# Patient Record
Sex: Female | Born: 1950 | Race: Black or African American | Hispanic: No | Marital: Single | Smoking: Never smoker
Health system: Southern US, Community
[De-identification: ages and names within clinical notes are randomized; demographics above are authoritative.]

## PROBLEM LIST (undated history)

## (undated) DIAGNOSIS — I739 Peripheral vascular disease, unspecified: Secondary | ICD-10-CM

## (undated) DIAGNOSIS — F1090 Alcohol use, unspecified, uncomplicated: Secondary | ICD-10-CM

## (undated) DIAGNOSIS — S46819A Strain of other muscles, fascia and tendons at shoulder and upper arm level, unspecified arm, initial encounter: Secondary | ICD-10-CM

## (undated) DIAGNOSIS — I471 Supraventricular tachycardia, unspecified: Secondary | ICD-10-CM

## (undated) DIAGNOSIS — K219 Gastro-esophageal reflux disease without esophagitis: Secondary | ICD-10-CM

## (undated) DIAGNOSIS — E559 Vitamin D deficiency, unspecified: Secondary | ICD-10-CM

## (undated) DIAGNOSIS — E669 Obesity, unspecified: Secondary | ICD-10-CM

## (undated) DIAGNOSIS — I509 Heart failure, unspecified: Secondary | ICD-10-CM

## (undated) DIAGNOSIS — J449 Chronic obstructive pulmonary disease, unspecified: Secondary | ICD-10-CM

## (undated) DIAGNOSIS — E785 Hyperlipidemia, unspecified: Secondary | ICD-10-CM

## (undated) DIAGNOSIS — I10 Essential (primary) hypertension: Secondary | ICD-10-CM

## (undated) DIAGNOSIS — R350 Frequency of micturition: Secondary | ICD-10-CM

## (undated) DIAGNOSIS — J309 Allergic rhinitis, unspecified: Secondary | ICD-10-CM

## (undated) DIAGNOSIS — Z6837 Body mass index (BMI) 37.0-37.9, adult: Secondary | ICD-10-CM

## (undated) DIAGNOSIS — I110 Hypertensive heart disease with heart failure: Secondary | ICD-10-CM

## (undated) DIAGNOSIS — D573 Sickle-cell trait: Secondary | ICD-10-CM

## (undated) DIAGNOSIS — I700 Atherosclerosis of aorta: Secondary | ICD-10-CM

## (undated) DIAGNOSIS — I2510 Atherosclerotic heart disease of native coronary artery without angina pectoris: Secondary | ICD-10-CM

## (undated) DIAGNOSIS — I517 Cardiomegaly: Secondary | ICD-10-CM

## (undated) DIAGNOSIS — M1990 Unspecified osteoarthritis, unspecified site: Secondary | ICD-10-CM

## (undated) DIAGNOSIS — E279 Disorder of adrenal gland, unspecified: Secondary | ICD-10-CM

## (undated) HISTORY — DX: Peripheral vascular disease, unspecified: I73.9

## (undated) HISTORY — DX: Hypertensive heart disease with heart failure: I11.0

## (undated) HISTORY — DX: Chronic obstructive pulmonary disease, unspecified: J44.9

## (undated) HISTORY — DX: Body mass index (BMI) 37.0-37.9, adult: Z68.37

## (undated) HISTORY — DX: Sickle-cell trait: D57.3

## (undated) HISTORY — DX: Cardiomegaly: I51.7

## (undated) HISTORY — DX: Unspecified osteoarthritis, unspecified site: M19.90

## (undated) HISTORY — DX: Atherosclerosis of aorta: I70.0

## (undated) HISTORY — DX: Vitamin D deficiency, unspecified: E55.9

## (undated) HISTORY — DX: Hyperlipidemia, unspecified: E78.5

## (undated) HISTORY — DX: Alcohol use, unspecified, uncomplicated: F10.90

## (undated) HISTORY — PX: NO PAST SURGERIES: SHX2092

## (undated) HISTORY — DX: Heart failure, unspecified: I50.9

## (undated) HISTORY — DX: Atherosclerotic heart disease of native coronary artery without angina pectoris: I25.10

## (undated) HISTORY — DX: Obesity, unspecified: E66.9

## (undated) HISTORY — PX: TUBAL LIGATION: SHX77

## (undated) HISTORY — DX: Allergic rhinitis, unspecified: J30.9

## (undated) HISTORY — DX: Gastro-esophageal reflux disease without esophagitis: K21.9

## (undated) HISTORY — DX: Frequency of micturition: R35.0

## (undated) HISTORY — DX: Strain of other muscles, fascia and tendons at shoulder and upper arm level, unspecified arm, initial encounter: S46.819A

## (undated) HISTORY — DX: Disorder of adrenal gland, unspecified: E27.9

## (undated) HISTORY — DX: Essential (primary) hypertension: I10

## (undated) HISTORY — DX: Supraventricular tachycardia, unspecified: I47.10

---

## 1998-05-15 ENCOUNTER — Encounter: Admission: RE | Admit: 1998-05-15 | Discharge: 1998-05-15 | Payer: Self-pay | Admitting: Internal Medicine

## 1998-07-02 ENCOUNTER — Encounter: Payer: Self-pay | Admitting: Emergency Medicine

## 1998-07-02 ENCOUNTER — Emergency Department (HOSPITAL_COMMUNITY): Admission: EM | Admit: 1998-07-02 | Discharge: 1998-07-03 | Payer: Self-pay | Admitting: Emergency Medicine

## 1998-09-15 ENCOUNTER — Emergency Department (HOSPITAL_COMMUNITY): Admission: EM | Admit: 1998-09-15 | Discharge: 1998-09-15 | Payer: Self-pay | Admitting: Emergency Medicine

## 1998-09-23 ENCOUNTER — Encounter: Payer: Self-pay | Admitting: Emergency Medicine

## 1998-09-23 ENCOUNTER — Emergency Department (HOSPITAL_COMMUNITY): Admission: EM | Admit: 1998-09-23 | Discharge: 1998-09-23 | Payer: Self-pay | Admitting: Emergency Medicine

## 1999-06-21 ENCOUNTER — Encounter: Admission: RE | Admit: 1999-06-21 | Discharge: 1999-06-21 | Payer: Self-pay | Admitting: Internal Medicine

## 2000-05-31 ENCOUNTER — Encounter: Admission: RE | Admit: 2000-05-31 | Discharge: 2000-05-31 | Payer: Self-pay | Admitting: Internal Medicine

## 2001-06-03 ENCOUNTER — Emergency Department (HOSPITAL_COMMUNITY): Admission: EM | Admit: 2001-06-03 | Discharge: 2001-06-04 | Payer: Self-pay | Admitting: Emergency Medicine

## 2001-08-09 ENCOUNTER — Encounter: Admission: RE | Admit: 2001-08-09 | Discharge: 2001-08-09 | Payer: Self-pay | Admitting: Internal Medicine

## 2004-11-26 ENCOUNTER — Emergency Department (HOSPITAL_COMMUNITY): Admission: EM | Admit: 2004-11-26 | Discharge: 2004-11-26 | Payer: Self-pay | Admitting: Emergency Medicine

## 2008-01-28 ENCOUNTER — Emergency Department (HOSPITAL_COMMUNITY): Admission: EM | Admit: 2008-01-28 | Discharge: 2008-01-29 | Payer: Self-pay | Admitting: Emergency Medicine

## 2009-01-05 ENCOUNTER — Encounter: Payer: Self-pay | Admitting: Family Medicine

## 2009-09-20 ENCOUNTER — Inpatient Hospital Stay (HOSPITAL_COMMUNITY): Admission: EM | Admit: 2009-09-20 | Discharge: 2009-09-21 | Payer: Self-pay | Admitting: Emergency Medicine

## 2009-10-23 ENCOUNTER — Encounter: Payer: Self-pay | Admitting: Family Medicine

## 2009-11-20 ENCOUNTER — Encounter: Payer: Self-pay | Admitting: Family Medicine

## 2010-02-19 ENCOUNTER — Ambulatory Visit: Payer: Self-pay | Admitting: Family Medicine

## 2010-02-19 DIAGNOSIS — IMO0001 Reserved for inherently not codable concepts without codable children: Secondary | ICD-10-CM | POA: Insufficient documentation

## 2010-02-19 DIAGNOSIS — E669 Obesity, unspecified: Secondary | ICD-10-CM

## 2010-02-19 DIAGNOSIS — M25519 Pain in unspecified shoulder: Secondary | ICD-10-CM

## 2010-04-02 ENCOUNTER — Other Ambulatory Visit: Admission: RE | Admit: 2010-04-02 | Discharge: 2010-04-02 | Payer: Self-pay | Admitting: Family Medicine

## 2010-04-02 ENCOUNTER — Ambulatory Visit: Payer: Self-pay | Admitting: Family Medicine

## 2010-04-02 DIAGNOSIS — K921 Melena: Secondary | ICD-10-CM

## 2010-04-02 DIAGNOSIS — Z780 Asymptomatic menopausal state: Secondary | ICD-10-CM | POA: Insufficient documentation

## 2010-04-05 ENCOUNTER — Ambulatory Visit: Payer: Self-pay | Admitting: Family Medicine

## 2010-04-07 ENCOUNTER — Encounter (INDEPENDENT_AMBULATORY_CARE_PROVIDER_SITE_OTHER): Payer: Self-pay | Admitting: *Deleted

## 2010-04-09 LAB — CONVERTED CEMR LAB
ALT: 19 units/L (ref 0–35)
Alkaline Phosphatase: 72 units/L (ref 39–117)
BUN: 15 mg/dL (ref 6–23)
Basophils Absolute: 0 10*3/uL (ref 0.0–0.1)
Cholesterol: 236 mg/dL — ABNORMAL HIGH (ref 0–200)
Eosinophils Absolute: 0.2 10*3/uL (ref 0.0–0.7)
GFR calc non Af Amer: 142.03 mL/min (ref >60)
HDL: 49.4 mg/dL (ref >39.00)
Hemoglobin: 11.9 g/dL — ABNORMAL LOW (ref 12.0–15.0)
Monocytes Relative: 5.9 % (ref 3.0–12.0)
Platelets: 283 10*3/uL (ref 150.0–400.0)
Potassium: 4.2 meq/L (ref 3.5–5.1)
RBC: 4.16 M/uL (ref 3.87–5.11)
Sodium: 146 meq/L — ABNORMAL HIGH (ref 135–145)
Total Bilirubin: 0.6 mg/dL (ref 0.3–1.2)
Total Protein: 7.1 g/dL (ref 6.0–8.3)

## 2010-04-12 ENCOUNTER — Telehealth (INDEPENDENT_AMBULATORY_CARE_PROVIDER_SITE_OTHER): Payer: Self-pay | Admitting: *Deleted

## 2010-04-16 ENCOUNTER — Encounter (INDEPENDENT_AMBULATORY_CARE_PROVIDER_SITE_OTHER): Payer: Self-pay

## 2010-04-20 ENCOUNTER — Encounter: Payer: Self-pay | Admitting: Family Medicine

## 2010-04-20 ENCOUNTER — Ambulatory Visit: Payer: Self-pay | Admitting: Gastroenterology

## 2010-04-20 ENCOUNTER — Encounter: Admission: RE | Admit: 2010-04-20 | Discharge: 2010-04-20 | Payer: Self-pay | Admitting: Family Medicine

## 2010-04-29 ENCOUNTER — Encounter: Payer: Self-pay | Admitting: Family Medicine

## 2010-04-29 ENCOUNTER — Encounter: Admission: RE | Admit: 2010-04-29 | Discharge: 2010-04-29 | Payer: Self-pay | Admitting: Family Medicine

## 2010-06-22 ENCOUNTER — Ambulatory Visit: Payer: Self-pay | Admitting: Gastroenterology

## 2010-10-14 ENCOUNTER — Ambulatory Visit
Admission: RE | Admit: 2010-10-14 | Discharge: 2010-10-14 | Payer: Self-pay | Source: Home / Self Care | Attending: Family Medicine | Admitting: Family Medicine

## 2010-10-14 DIAGNOSIS — E785 Hyperlipidemia, unspecified: Secondary | ICD-10-CM | POA: Insufficient documentation

## 2010-10-16 ENCOUNTER — Other Ambulatory Visit: Payer: Self-pay | Admitting: Family Medicine

## 2010-10-16 DIAGNOSIS — N63 Unspecified lump in unspecified breast: Secondary | ICD-10-CM

## 2010-10-17 ENCOUNTER — Encounter: Payer: Self-pay | Admitting: Family Medicine

## 2010-10-21 ENCOUNTER — Ambulatory Visit
Admission: RE | Admit: 2010-10-21 | Discharge: 2010-10-21 | Payer: Self-pay | Source: Home / Self Care | Attending: Family Medicine | Admitting: Family Medicine

## 2010-10-21 ENCOUNTER — Other Ambulatory Visit: Payer: Self-pay | Admitting: Family Medicine

## 2010-10-21 ENCOUNTER — Telehealth (INDEPENDENT_AMBULATORY_CARE_PROVIDER_SITE_OTHER): Payer: Self-pay | Admitting: *Deleted

## 2010-10-21 LAB — HEPATIC FUNCTION PANEL
ALT: 15 U/L (ref 0–35)
AST: 17 U/L (ref 0–37)
Total Bilirubin: 0.6 mg/dL (ref 0.3–1.2)

## 2010-10-21 LAB — CBC WITH DIFFERENTIAL/PLATELET
Basophils Relative: 0.5 % (ref 0.0–3.0)
Eosinophils Absolute: 0.2 10*3/uL (ref 0.0–0.7)
HCT: 36.6 % (ref 36.0–46.0)
Lymphs Abs: 2 10*3/uL (ref 0.7–4.0)
MCV: 83.9 fl (ref 78.0–100.0)
RBC: 4.36 Mil/uL (ref 3.87–5.11)
WBC: 9.6 10*3/uL (ref 4.5–10.5)

## 2010-10-21 LAB — IBC PANEL
Iron: 64 ug/dL (ref 42–145)
Saturation Ratios: 18 % — ABNORMAL LOW (ref 20.0–50.0)
Transferrin: 254.5 mg/dL (ref 212.0–360.0)

## 2010-10-21 LAB — LIPID PANEL
Cholesterol: 250 mg/dL — ABNORMAL HIGH (ref 0–200)
HDL: 44.5 mg/dL (ref >39.00)
Triglycerides: 129 mg/dL (ref 0.0–149.0)

## 2010-10-21 LAB — FERRITIN: Ferritin: 61.9 ng/mL (ref 10.0–291.0)

## 2010-10-24 LAB — CONVERTED CEMR LAB: Pap Smear: NEGATIVE

## 2010-10-28 NOTE — Letter (Signed)
Summary: [facility] Instructions  [facility]  [address] [place], [place] [address]   Phone: [phone]  Fax: [phone]       [facility]    [birth year]    MRN: [id number]        Procedure Day /Date:  04/28/10   Wednesday     Arrival Time:  1:30pm     Procedure Time:  2:30pm     Location of Procedure:                    _x _  [facility] (4th Floor)                        PREPARATION FOR COLONOSCOPY WITH MOVIPREP   Starting 5 days prior to your procedure _7/29/11 _ do not eat nuts, seeds, popcorn, corn, beans, peas,  salads, or any raw vegetables.  Do not take any fiber supplements (e.g. Metamucil, Citrucel, and Benefiber).  THE DAY BEFORE YOUR PROCEDURE         DATE:   61/2/11  DAY:  Tuesday 1.  Drink clear liquids the entire day-NO SOLID FOOD  2.  Do not drink anything colored red or purple.  Avoid juices with pulp.  No orange juice.  3.  Drink at least 64 oz. (8 glasses) of fluid/clear liquids during the day to prevent dehydration and help the prep work efficiently.  CLEAR LIQUIDS INCLUDE: Water Jello Ice Popsicles Tea (sugar ok, no milk/cream) Powdered fruit flavored drinks Coffee (sugar ok, no milk/cream) Gatorade Juice: apple, white grape, white cranberry  Lemonade Clear bullion, consomm, broth Carbonated beverages (any kind) Strained chicken noodle soup Hard Candy                             4.  In the morning, mix first dose of MoviPrep solution:    Empty 1 Pouch A and 1 Pouch B into the disposable container    Add lukewarm drinking water to the top line of the container. Mix to dissolve    Refrigerate (mixed solution should be used within 24 hrs)  5.  Begin drinking the prep at 5:00 p.m. The MoviPrep container is divided by 4 marks.   Every 15 minutes drink the solution down to the next mark (approximately 8 oz) until the full liter is complete.   6.  Follow completed prep with 16 oz of clear liquid of your choice  (Nothing red or purple).  Continue to drink clear liquids until bedtime.  7.  Before going to bed, mix second dose of MoviPrep solution:    Empty 1 Pouch A and 1 Pouch B into the disposable container    Add lukewarm drinking water to the top line of the container. Mix to dissolve    Refrigerate  THE DAY OF YOUR PROCEDURE      DATE:   04/28/10  DAY:  Tuesday  Beginning at 9:30am (5 hours before procedure):         1. Every 15 minutes, drink the solution down to the next mark (approx 8 oz) until the full liter is complete.  2. Follow completed prep with 16 oz. of clear liquid of your choice.    3. You may drink clear liquids until  12:30pm      (2 hours before your procedure)   MEDICATION INSTRUCTIONS  Unless otherwise instructed, you should take regular prescription medications  with a small sip of water   as early as possible the morning of your procedure.         OTHER INSTRUCTIONS  You will need a responsible adult at least 61 years of age to accompany you and drive you home.   This person must remain in the waiting room during your procedure.  Wear loose fitting clothing that is easily removed.  Leave jewelry and other valuables at home.  However, you may wish to bring a book to read or  an iPod/MP3 player to listen to music as you wait for your procedure to start.  Remove all body piercing jewelry and leave at home.  Total time from sign-in until discharge is approximately 2-3 hours.  You should go home directly after your procedure and rest.  You can resume normal activities the  day after your procedure.  The day of your procedure you should not:   Drive   Make legal decisions   Operate machinery   Drink alcohol   Return to work  You will receive specific instructions about eating, activities and medications before you leave.    The above instructions have been reviewed and explained to me by   Ulis Rias RN  April 20, 2010 8:03 AM     I fully  understand and can verbalize these instructions _____________________________ Date _________

## 2010-10-28 NOTE — Letter (Signed)
Summary: [facility]  [facility]   Imported By: Lanelle Bal 03/01/2010 14:14:21  _____________________________________________________________________  External Attachment:    Type:   Image     Comment:   External Document

## 2010-10-28 NOTE — Miscellaneous (Signed)
Summary: [facility] previsit  Clinical Lists Changes  Medications: Added new medication of MOVIPREP 100 GM  SOLR (PEG-KCL-NACL-NASULF-NA ASC-C) As per prep instructions. - Signed Rx of MOVIPREP 100 GM  SOLR (PEG-KCL-NACL-NASULF-NA ASC-C) As per prep instructions.;  #1 x 0;  Signed;  Entered by: Ulis Rias RN;  Authorized by: Meryl Dare MD [facility];  Method used: Electronically to [facility]. # [id number]*, [address] [place], [place], [place]  [address], Ph: [phone] or [phone], Fax: [phone] Observations: Added new observation of NKA: T (04/20/2010 7:41)    Prescriptions: MOVIPREP 100 GM  SOLR (PEG-KCL-NACL-NASULF-NA ASC-C) As per prep instructions.  #1 x 0   Entered by:   Ulis Rias RN   Authorized by:   Meryl Dare MD [facility]   Signed by:   Ulis Rias RN on 04/20/2010   Method used:   Electronically to        [id number]* (retail)       [address]       [place], [place]  [address]       Ph: [phone] or [phone]       Fax: [phone]   RxID:   [id number]

## 2010-10-28 NOTE — Progress Notes (Signed)
Summary: lab results  Phone Note Call from Patient Call back at [phone]   Caller: Patient Details for Reason: slightly anemic---take a multivitamin with iron daily Summary of Call: Message left on Triage VM: Patient was returning someone's    Chrae [facility] CMA  April 12, 2010 12:32 PM   left message to call  office....................Marland KitchenFelecia Deloach CMA  April 12, 2010 4:39 PM  DISCUSS WITH PATIENT.................Marland KitchenFelecia Deloach CMA  April 13, 2010 9:09 AM

## 2010-10-28 NOTE — Op Note (Signed)
Summary: Post Procedure Note/[facility]  Post Procedure Note/[facility]   Imported By: Lanelle Bal 03/01/2010 14:13:40  _____________________________________________________________________  External Attachment:    Type:   Image     Comment:   External Document

## 2010-10-28 NOTE — Letter (Signed)
Summary: Previsit letter  [facility]  [address] [place], [place] [address]   Phone: [phone]  Fax: [phone]       04/07/2010 MRN: [id number]  [facility] [address] [place], [place]  [address]  Dear Ms. Kinnear,  Welcome to the Gastroenterology Division at [facility].    You are scheduled to see a nurse for your pre-procedure visit on 04/20/2010 at 8:00am on the 3rd floor at [facility], [address]  We ask that you try to arrive at our office 15 minutes prior to your appointment time to allow for check-in.  Your nurse visit will consist of discussing your medical and surgical history, your immediate family medical history, and your medications.    Please bring a complete list of all your medications or, if you prefer, bring the medication bottles and we will list them.  We will need to be aware of both prescribed and over the counter drugs.  We will need to know exact dosage information as well.  If you are on blood thinners (Coumadin, Plavix, Aggrenox, Ticlid, etc.) please call our office today/prior to your appointment, as we need to consult with your physician about holding your medication.   Please be prepared to read and sign documents such as consent forms, a financial agreement, and acknowledgement forms.  If necessary, and with your consent, a friend or relative is welcome to sit-in on the nurse visit with you.  Please bring your insurance card so that we may make a copy of it.  If your insurance requires a referral to see a specialist, please bring your referral form from your primary care physician.  No co-pay is required for this nurse visit.     If you cannot keep your appointment, please call [phone] to cancel or reschedule prior to your appointment date.  This allows [place] the opportunity to schedule an appointment for another patient in need of care.    Thank you for choosing  Gastroenterology for your medical  needs.  We appreciate the opportunity to care for you.  Please visit [place] at our website  to learn more about our practice.                     Sincerely.                                                                                                                   The Gastroenterology Division

## 2010-10-28 NOTE — Assessment & Plan Note (Signed)
Summary: new to est/cbs   Vital Signs:  Patient profile:   60 year old female Height:      65 inches Weight:      231 pounds BMI:     38.58 Pulse rate:   83 / minute Pulse rhythm:   regular BP sitting:   126 / 80  (left arm) Cuff size:   regular  Vitals Entered By: Army Fossa CMA (Feb 19, 2010 2:07 PM) CC: Pt here to establish, and discuss joints.    History of Present Illness: Pt is here to establish and complains of not being able to lose weight and joint pain in L shoulders mostly at night--- she is stiff and then loosens up with time.   She fell this past winter on her l shoulder.   Pt sees pulmonologist in [place]--Dr Randalyn Rhea for asthma.     Preventive Screening-Counseling & Management  Alcohol-Tobacco     Alcohol drinks/day: <1     Smoking Status: never  Caffeine-Diet-Exercise     Caffeine use/day: <1     Diet Comments: Poor     Diet Counseling: to improve diet; diet is suboptimal     Does Patient Exercise: no     Exercise Counseling: to improve exercise regimen      Drug Use:  no.    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  Risk Factors: Alcohol Use: <1 (02/19/2010) Caffeine Use: <1 (02/19/2010) Diet: Poor (02/19/2010) Exercise: no (02/19/2010)  Risk Factors: Smoking Status: never (02/19/2010)  Past Medical History: Asthma  Past Surgical History: Denies surgical history  Family History: Family History Diabetes 1st degree relative Family History Hypertension  Social History: Widow/Widower Never Smoked Alcohol use-yes Drug use-no Regular exercise-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 Lungs are clear to auscultation, no crackles or wheezes. Heart:  normal rate and no murmur.   Abdomen:  Bowel sounds positive,abdomen soft and non-tender without masses, organomegaly or hernias noted. OBESE Msk:  normal ROM, no joint tenderness, no joint swelling, no joint warmth, no redness over joints, and no joint deformities.   Neurologic:  alert & oriented X3.   Skin:  Intact without suspicious lesions or rashes Psych:  Oriented X3 and normally interactive.     Impression & Recommendations:  Problem # 1:  SHOULDER PAIN, LEFT (ICD-719.41)  Her updated medication list for this problem includes:    Mobic 15 Mg Tabs (Meloxicam) .Marland Kitchen... 1/2 - 1 by mouth once daily  Discussed shoulder exercises, use of moist heat or ice, and medication.   Problem # 2:  ASTHMA (ICD-493.90) per pulm Her updated medication list for this problem includes:    Proventil Hfa 108 (90 Base) Mcg/act Aers (Albuterol sulfate)    Albuterol Sulfate (2.5 Mg/69ml) 0.083% Nebu (Albuterol sulfate)    Zyflo Cr 600 Mg Xr12h-tab (Zileuton) .Marland Kitchen... Take 2 by mouth every 12 hrs  Problem # 3:  OBESITY (ICD-278.00) OPTIFAST INFO GIVEN DISCUSS WEIGHT WATCHERS AND NUTRITIONIST AS WELL. DISCUSSED DIET  AND EXERCISE  Ht: 65 (02/19/2010)   Wt: 231 (02/19/2010)   BMI: 38.58 (02/19/2010)  Complete Medication List: 1)  Proventil Hfa 108 (90 Base) Mcg/act Aers (Albuterol sulfate) 2)  Albuterol Sulfate (2.5 Mg/4ml) 0.083% Nebu (Albuterol sulfate) 3)  Zyflo Cr 600 Mg Xr12h-tab (Zileuton) .... Take 2 by mouth every 12 hrs 4)  Mobic 15 Mg Tabs (Meloxicam) .... 1/2 - 1 by mouth once daily  Patient Instructions: 1)  schedule physical  Prescriptions: MOBIC 15 MG TABS (MELOXICAM)  1/2 - 1 by mouth once daily  #30 x 2   Entered and Authorized by:   Loreen Freud DO   Signed by:   Loreen Freud DO on 02/19/2010   Method used:   Electronically to        [id number]* (retail)       [address]       [place], [place]  [address]       Ph: [phone] or [phone]       Fax: [phone]   RxID:   [id number]

## 2010-10-28 NOTE — Assessment & Plan Note (Signed)
Summary: 6 month followup///sph   Vital Signs:  Patient profile:   60 year old female Weight:      235.0 pounds Pulse rate:   60 / minute Pulse rhythm:   regular BP sitting:   148 / 80  (right arm) Cuff size:   large  Vitals Entered By: Almeta Monas CMA Duncan Dull) (October 14, 2010 3:57 PM) CC: 6 mo f/u---denies any problems   History of Present Illness: Pt here to f/u from cpe.  She has had a cold and is using neb at bedtime.  Pt never had labs re done.   She will see her "asthma"doctor next week.  No new complaints.  Pt is looking into a gym membership so she can start working out.    Current Medications (verified): 1)  Proventil Hfa 108 (90 Base) Mcg/act Aers (Albuterol Sulfate) 2)  Albuterol Sulfate (2.5 Mg/42ml) 0.083% Nebu (Albuterol Sulfate) 3)  Zyflo Cr 600 Mg Xr12h-Tab (Zileuton) .... Take 2 By Mouth Every 12 Hrs 4)  Mobic 15 Mg Tabs (Meloxicam) .... 1/2 - 1 By Mouth Once Daily 5)  Advair Diskus 250-50 Mcg/dose Aepb (Fluticasone-Salmeterol) .Marland Kitchen.. 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 Family History Diabetes 1st degree relative Family History Hypertension S--CHF  Social History: Reviewed history from 02/19/2010 and no changes required. 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   Lungs:  Normal respiratory effort, chest expands symmetrically. Lungs are clear to auscultation, no crackles or wheezes. Heart:  normal rate and no murmur.   Psych:  Oriented X3 and normally interactive.     Impression & Recommendations:  Problem # 1:  HYPERLIPIDEMIA (ICD-272.4)  Labs Reviewed: SGOT: 21 (04/05/2010)   SGPT: 19 (04/05/2010)   HDL:49.40 (04/05/2010)  Chol:236 (04/05/2010)  Trig:129.0 (04/05/2010)  Problem # 2:  OBESITY (ICD-278.00)  Ht: 65 (04/02/2010)   Wt: 235.0 (10/14/2010)   BMI: 38.58 (02/19/2010)  Complete Medication List: 1)  Proventil Hfa 108 (90 Base) Mcg/act Aers (Albuterol sulfate) 2)  Albuterol Sulfate (2.5 Mg/52ml) 0.083% Nebu (Albuterol sulfate) 3)  Zyflo Cr 600 Mg Xr12h-tab (Zileuton) .... Take 2 by mouth every 12 hrs 4)  Mobic 15 Mg Tabs (Meloxicam) .... 1/2 - 1 by mouth once daily 5)  Advair Diskus 250-50 Mcg/dose Aepb (Fluticasone-salmeterol) .Marland Kitchen.. 1 inh two times a day  Patient Instructions: 1)  fasting labs  272.4  lipid, hep,   285.9  cbcd,  ibc, ferritn, b12   Orders Added: 1)  Est. Patient Level III [[address]]

## 2010-10-28 NOTE — Assessment & Plan Note (Signed)
Summary: cpx/cbs   Vital Signs:  Patient profile:   60 year old female Height:      65 inches Weight:      233 pounds Temp:     98.2 degrees F oral Pulse rate:   80 / minute Resp:     18 per minute BP sitting:   140 / 80  (left arm)  Vitals Entered By: Jeremy Johann CMA (April 02, 2010 1:25 PM) CC: cpx, not fasting, pap Comments REVIEWED MED LIST, PATIENT AGREED DOSE AND INSTRUCTION CORRECT    History of Present Illness: Pt here for cpe , pap--pt is not fasting.   Pt sister went into hospital with SOB last night and was admitted. Pt under a lot of stress.  Pt with no complaints herself.  Shoulder is better.    Asthma History    Initial Asthma Severity Rating:    Age range: 12+ years    Symptoms: daily    Nighttime Awakenings: often 7/week    Interferes w/ normal activity: some limitations    SABA use (not for EIB): daily    Exacerbations requiring oral systemic steroids: 0-1/year    Asthma Severity Assessment: Severe Persistent   Preventive Screening-Counseling & Management  Alcohol-Tobacco     Alcohol drinks/day: <1     Smoking Status: never  Caffeine-Diet-Exercise     Caffeine use/day: <1     Diet Comments: Poor     Diet Counseling: to improve diet; diet is suboptimal     Does Patient Exercise: yes     Type of exercise: walking ,  pool      Exercise (avg: min/session): 30-60     Times/week: <3     Exercise Counseling: to improve exercise regimen  Hep-HIV-STD-Contraception     Dental Visit-last 6 months yes     Dental Care Counseling: not indicated; dental care within six months     SBE monthly: yes     SBE Education/Counseling: not indicated; SBE done regularly  Safety-Violence-Falls     Seat Belt Use: yes  Current Medications (verified): 1)  Proventil Hfa 108 (90 Base) Mcg/act Aers (Albuterol Sulfate) 2)  Albuterol Sulfate (2.5 Mg/40ml) 0.083% Nebu (Albuterol Sulfate) 3)  Zyflo Cr 600 Mg Xr12h-Tab (Zileuton) .... Take 2 By Mouth Every 12 Hrs 4)  Mobic  15 Mg Tabs (Meloxicam) .... 1/2 - 1 By Mouth Once Daily 5)  Advair Diskus 250-50 Mcg/dose Aepb (Fluticasone-Salmeterol) .Marland Kitchen.. 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 Family History Diabetes 1st degree relative Family History Hypertension S--CHF  Social History: Reviewed history from 02/19/2010 and no changes required. 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 Eyes:  Denies blurring, discharge, double vision, eye irritation, eye pain, halos, itching, light sensitivity, red eye, vision loss-1 eye, and vision loss-both eyes; Optho---q1y  . ENT:  Denies decreased hearing, difficulty swallowing, ear discharge, earache, hoarseness, nasal congestion, nosebleeds, postnasal drainage, ringing in ears, sinus pressure, and sore throat. CV:  Denies bluish discoloration of lips or nails, chest pain or discomfort, difficulty breathing at night, difficulty breathing while lying down, fainting, fatigue, leg cramps with  exertion, lightheadness, near fainting, palpitations, shortness of breath with exertion, swelling of feet, swelling of hands, and weight gain. Resp:  Complains of shortness of breath and wheezing; denies chest discomfort, chest pain with inspiration, cough, coughing up blood, excessive snoring, hypersomnolence, morning headaches, pleuritic, and sputum productive. GI:  Denies abdominal pain, bloody stools, change in bowel habits, constipation, dark tarry stools, diarrhea, excessive appetite, gas, hemorrhoids, indigestion, and loss of appetite. GU:  Denies abnormal vaginal bleeding, decreased libido, discharge, dysuria, genital sores, hematuria, incontinence, nocturia, urinary frequency, and urinary hesitancy. MS:  Denies joint pain, joint redness, joint swelling, loss of strength, low back pain, mid back pain, muscle aches, muscle , cramps, muscle weakness, stiffness, and thoracic pain. Derm:  Denies changes in color of skin, changes in nail beds, dryness, excessive perspiration, flushing, hair loss, insect bite(s), itching, lesion(s), poor wound healing, and rash. Neuro:  Denies brief paralysis, difficulty with concentration, disturbances in coordination, falling down, headaches, inability to speak, memory loss, numbness, poor balance, seizures, sensation of room spinning, tingling, tremors, visual disturbances, and weakness. Psych:  Denies alternate hallucination ( auditory/visual), anxiety, depression, easily angered, easily tearful, irritability, mental problems, panic attacks, sense of great danger, suicidal thoughts/plans, thoughts of violence, unusual visions or sounds, and thoughts /plans of harming others. Endo:  Denies cold intolerance, excessive hunger, excessive thirst, excessive urination, heat intolerance, polyuria, and weight change. Heme:  Denies abnormal bruising, bleeding, enlarge lymph nodes, fevers, pallor, and skin discoloration. Allergy:  Complains of seasonal allergies.  Physical  Exam  General:  Well-developed,well-nourished,in no acute distress; alert,appropriate and cooperative throughout examination Head:  Normocephalic and atraumatic without obvious abnormalities. No apparent alopecia or balding. Eyes:  vision grossly intact, pupils equal, pupils round, pupils reactive to light, and no injection.   Ears:  External ear exam shows no significant lesions or deformities.  Otoscopic examination reveals clear canals, tympanic membranes are intact bilaterally without bulging, retraction, inflammation or discharge. Hearing is grossly normal bilaterally. Nose:  External nasal examination shows no deformity or inflammation. Nasal mucosa are pink and moist without lesions or exudates. Mouth:  Oral mucosa and oropharynx without lesions or exudates.  Teeth in good repair. Neck:  No deformities, masses, or tenderness noted.no carotid bruits.   Chest Wall:  No deformities, masses, or tenderness noted. Breasts:  No mass, nodules, thickening, tenderness, bulging, retraction, inflamation, nipple discharge or skin changes noted.   Lungs:  Normal respiratory effort, chest expands symmetrically. Lungs are clear to auscultation, no crackles or wheezes. Heart:  Normal rate and regular rhythm. S1 and S2 normal without gallop, murmur, click, rub or other extra sounds. Abdomen:  Bowel sounds positive,abdomen soft and non-tender without masses, organomegaly or hernias noted. Rectal:  No external abnormalities noted. Normal sphincter tone. No rectal masses or tenderness.stool positive for occult blood.   Genitalia:  Pelvic Exam:        External: normal female genitalia without lesions or masses        Vagina: normal without lesions or masses  Cervix: normal without lesions or masses        Adnexa: normal bimanual exam without masses or fullness        Uterus: normal by palpation        Pap smear: performed Msk:  normal ROM, no joint tenderness, no joint swelling, no joint warmth, no  redness over joints, no joint deformities, and no crepitation.   Pulses:  R posterior tibial normal, R dorsalis pedis normal, R carotid normal, L posterior tibial normal, L dorsalis pedis normal, and L carotid normal.   Extremities:  No clubbing, cyanosis, edema, or deformity noted with normal full range of motion of all joints.   Neurologic:  alert & oriented X3 and gait normal.   Skin:  Intact without suspicious lesions or rashes Cervical Nodes:  No lymphadenopathy noted Axillary Nodes:  No palpable lymphadenopathy Psych:  Cognition and judgment appear intact. Alert and cooperative with normal attention span and concentration. No apparent delusions, illusions, hallucinations   Impression & Recommendations:  Problem # 1:  PREVENTIVE HEALTH CARE (ICD-V70.0)  ghM utd Orders: Radiology Referral (Radiology) Gastroenterology Referral (GI) EKG w/ Interpretation (93000)  Problem # 2:  POSTMENOPAUSAL STATUS (ICD-V49.81)  Orders: Radiology Referral (Radiology) EKG w/ Interpretation (93000)  Problem # 3:  OBESITY (ICD-278.00)  Ht: 65 (04/02/2010)   Wt: 233 (04/02/2010)   BMI: 38.58 (02/19/2010)  Problem # 4:  ASTHMA (ICD-493.90)  Her updated medication list for this problem includes:    Proventil Hfa 108 (90 Base) Mcg/act Aers (Albuterol sulfate)    Albuterol Sulfate (2.5 Mg/4ml) 0.083% Nebu (Albuterol sulfate)    Zyflo Cr 600 Mg Xr12h-tab (Zileuton) .Marland Kitchen... Take 2 by mouth every 12 hrs    Advair Diskus 250-50 Mcg/dose Aepb (Fluticasone-salmeterol) .Marland Kitchen... 1 inh two times a day  Problem # 5:  BLOOD IN STOOL (ICD-578.1)  Orders: Gastroenterology Referral (GI) EKG w/ Interpretation (93000)  Complete Medication List: 1)  Proventil Hfa 108 (90 Base) Mcg/act Aers (Albuterol sulfate) 2)  Albuterol Sulfate (2.5 Mg/19ml) 0.083% Nebu (Albuterol sulfate) 3)  Zyflo Cr 600 Mg Xr12h-tab (Zileuton) .... Take 2 by mouth every 12 hrs 4)  Mobic 15 Mg Tabs (Meloxicam) .... 1/2 - 1 by mouth once  daily 5)  Advair Diskus 250-50 Mcg/dose Aepb (Fluticasone-salmeterol) .Marland Kitchen.. 1 inh two times a day  Patient Instructions: 1)  fasting labs V70.0  bmp, lipid, hep, cbc, tsh.,  UA 2)  call allergy/ asthma for an appointment    EKG  Procedure date:  04/02/2010  Findings:      Normal sinus rhythm with rate of:  76    Flu Vaccine Result Date:  07/08/2009 Flu Vaccine Result:  given Flu Vaccine Next Due:  1 yr TD Result Date:  04/09/2008 TD Result:  given TD Next Due:  10 yr

## 2010-10-28 NOTE — Progress Notes (Signed)
Summary: LABS SENT ASTHMA DOCTOR  Phone Note Call from Patient Call back at Home Phone [phone]   Caller: Patient Call For: Loreen Freud DO Summary of Call: PT HAD LABS DONE THIS MORNING SHE NEEDS THEM SENT TO HER ASTHMA  DOCTOR IN [place]--FAX--[phone] Initial call taken by: Freddy Jaksch,  October 21, 2010 8:26 AM  Follow-up for Phone Call        faxed.... Follow-up by: Almeta Monas CMA Duncan Dull),  October 22, 2010 4:35 PM

## 2010-10-28 NOTE — Procedures (Signed)
Summary: Colonoscopy  Patient: Jerrilynn Purifoy Note: All result statuses are Final unless otherwise noted.  Tests: (1) Colonoscopy (COL)   COL Colonoscopy           DONE     [facility]     [address]     [place], [place]  [address]           COLONOSCOPY PROCEDURE REPORT     PATIENT:  Forestine, Macho  MR#:  [id number]     BIRTHDATE:  28-Nov-1950, 59 yrs. old  GENDER:  female     ENDOSCOPIST:  Judie Petit T. Russella Dar, MD, [facility]     Referred by:  Loreen Freud, DO     PROCEDURE DATE:  06/22/2010     PROCEDURE:  Colonoscopy 19147     ASA CLASS:  Class II     INDICATIONS:  1) heme positive stool  2) hematochezia     MEDICATIONS:   Fentanyl 75 mcg IV, Versed 7 mg IV     DESCRIPTION OF PROCEDURE:   After the risks benefits and     alternatives of the procedure were thoroughly explained, informed     consent was obtained.  Digital rectal exam was performed and     revealed no abnormalities.   The LB [id number]-[id number] endoscope     was introduced through the anus and advanced to the cecum, which     was identified by both the appendix and ileocecal valve, without     limitations.  The quality of the prep was good, using MoviPrep.     The instrument was then slowly withdrawn as the colon was fully     examined.     <<PROCEDUREIMAGES>>     FINDINGS:  Moderate diverticulosis was found in the sigmoid colon.     A normal appearing cecum, ileocecal valve, and appendiceal orifice     were identified. The ascending, hepatic flexure, transverse,     splenic flexure, descending colon, and rectum appeared     unremarkable. Retroflexed views in the rectum revealed internal     hemorrhoids, small. The time to cecum =  1.25  minutes. The scope     was then withdrawn (time =  8.5  min) from the patient and the     procedure completed.           COMPLICATIONS:  None           ENDOSCOPIC IMPRESSION:     1) Moderate diverticulosis in the sigmoid colon     2) Internal hemorrhoids        RECOMMENDATIONS:     1) High fiber diet with liberal fluid intake.     2) Continue current colorectal screening for "routine risk"     patients with a repeat colonoscopy in 10 years.           Venita Lick. Russella Dar, MD, [facility]           n.     eSIGNED:   Venita Lick. Stark at 06/22/2010 09:35 AM           [place]adders, [place], [phone]  Note: An exclamation mark (!) indicates a result that was not dispersed into the flowsheet. Document Creation Date: 06/22/2010 9:36 AM _______________________________________________________________________  (1) Order result status: Final Collection or observation date-time: 06/22/2010 09:33 Requested date-time:  Receipt date-time:  Reported date-time:  Referring Physician:   Ordering Physician: Claudette Head [phone]) Specimen Source:  Source: Launa Grill Order Number: [id number] Lab site:  Appended Document: Colonoscopy    Clinical Lists Changes  Observations: Added new observation of COLONNXTDUE: 05/2020 (06/22/2010 15:53)

## 2010-10-28 NOTE — Letter (Signed)
Summary: [facility]  [facility]   Imported By: Lanelle Bal 03/01/2010 14:15:03  _____________________________________________________________________  External Attachment:    Type:   Image     Comment:   External Document

## 2010-11-04 ENCOUNTER — Ambulatory Visit
Admission: RE | Admit: 2010-11-04 | Discharge: 2010-11-04 | Disposition: A | Discharge status: Home / Self Care | Payer: 59 | Source: Ambulatory Visit | Attending: Family Medicine | Admitting: Family Medicine

## 2010-11-04 DIAGNOSIS — N63 Unspecified lump in unspecified breast: Secondary | ICD-10-CM

## 2010-12-27 LAB — CBC
HCT: 34.8 % — ABNORMAL LOW (ref 36.0–46.0)
Hemoglobin: 11.5 g/dL — ABNORMAL LOW (ref 12.0–15.0)
Hemoglobin: 11.9 g/dL — ABNORMAL LOW (ref 12.0–15.0)
MCV: 83.2 fL (ref 78.0–100.0)
MCV: 83.9 fL (ref 78.0–100.0)
RBC: 4.15 MIL/uL (ref 3.87–5.11)
RBC: 4.31 MIL/uL (ref 3.87–5.11)
RDW: 14.4 % (ref 11.5–15.5)
WBC: 8.3 10*3/uL (ref 4.0–10.5)
WBC: 8.6 10*3/uL (ref 4.0–10.5)

## 2010-12-27 LAB — BASIC METABOLIC PANEL
Chloride: 101 mEq/L (ref 96–112)
GFR calc non Af Amer: >60 mL/min (ref >60)
Sodium: 138 mEq/L (ref 135–145)

## 2010-12-27 LAB — DIFFERENTIAL
Basophils Relative: 0 % (ref 0–1)
Eosinophils Relative: 3 % (ref 0–5)
Lymphocytes Relative: 13 % (ref 12–46)
Lymphs Abs: 1 10*3/uL (ref 0.7–4.0)
Monocytes Relative: 4 % (ref 3–12)
Neutrophils Relative %: 81 % — ABNORMAL HIGH (ref 43–77)

## 2010-12-27 LAB — CK TOTAL AND CKMB (NOT AT ARMC): Relative Index: 1.9 (ref 0.0–2.5)

## 2010-12-27 LAB — POCT I-STAT, CHEM 8
BUN: 9 mg/dL (ref 6–23)
Calcium, Ion: 1.1 mmol/L — ABNORMAL LOW (ref 1.12–1.32)
Chloride: 105 mEq/L (ref 96–112)
Creatinine, Ser: 0.6 mg/dL (ref 0.4–1.2)
Glucose, Bld: 130 mg/dL — ABNORMAL HIGH (ref 70–99)
Hemoglobin: 12.2 g/dL (ref 12.0–15.0)
Potassium: 4.2 mEq/L (ref 3.5–5.1)
Sodium: 141 mEq/L (ref 135–145)

## 2011-01-26 ENCOUNTER — Encounter: Payer: Self-pay | Admitting: Family Medicine

## 2011-01-27 ENCOUNTER — Ambulatory Visit (INDEPENDENT_AMBULATORY_CARE_PROVIDER_SITE_OTHER): Payer: 59 | Admitting: Family Medicine

## 2011-01-27 ENCOUNTER — Encounter: Payer: Self-pay | Admitting: Family Medicine

## 2011-01-27 VITALS — BP 130/80 | HR 68 | Wt 233.2 lb

## 2011-01-27 DIAGNOSIS — S139XXA Sprain of joints and ligaments of unspecified parts of neck, initial encounter: Secondary | ICD-10-CM

## 2011-01-27 MED ORDER — CYCLOBENZAPRINE HCL 10 MG PO TABS: 10.0000 mg | ORAL_TABLET | Freq: Three times a day (TID) | ORAL | Status: DC | PRN

## 2011-01-27 NOTE — Assessment & Plan Note (Signed)
Warm compresses Stretches given to pt Flexeril  con't mobic prn Call if no better 1-2 weeks or if symptoms worsen

## 2011-01-27 NOTE — Patient Instructions (Signed)
Cervical and Neck Sprain and Strain (Neck Sprain and Strain) A cervical sprain is an injury to the neck. The injury can include either over-stretching or even small tears in the ligaments that hold the bones of the neck in place. A strain affects muscles and tendons. Minor injuries usually only involve ligaments and muscles. Because the different parts of the neck are so close together, more severe injuries can involve both sprain and strain. These injuries can affect the muscles, ligaments, tendons, discs, and nerves in the neck. SYMPTOMS  Pain, soreness, stiffness, or burning sensation in the front, back, or sides of the neck. This may develop immediately after injury. Onset of discomfort may also develop slowly and not begin for 24 hours or more.   Shoulder and/or upper back pain.   Limits to the normal movement of the neck.   Headache.   Dizziness.   Weakness and/or abnormal sensation (such as numbness or tingling) of one or both arms and/or hands.   Muscle spasm.   Difficulty with swallowing or chewing.   Tenderness and swelling at the injury site.  CAUSES An injury may be the result of a direct blow or from certain habits that can lead to the symptoms noted above.  Injury from:   Contact sports (such as football, rugby, wrestling, hockey, auto racing, gymnastics, diving, martial arts, and boxing).   Motor vehicle accidents.   Whiplash injuries (see image at right). These are common. They occur when the neck is forcefully whipped or forced backward and/or forward.   Falls.   Lifestyle or awkward postures:   Cradling a telephone between the ear and shoulder.   Sitting in a chair that offers no support.   Working at an ill-designed computer station.   Activities that require hours of repeated or long periods of looking up (stretching the neck backward) or looking down (bending the head/neck forward).  DIAGNOSIS  Most of the time, your caregiver can diagnose this  problem with a careful history and examination. The history will include information about known problems (such as arthritis in the neck) or a previous neck injury. X-rays may be ordered to find out if there is a different problem. X-rays can also help to find problems with the bones of the neck not related to the injury or current symptoms. TREATMENT Several treatment options are available to help pain, spasm, and other symptoms. They include:  Cold helps relieve pain and reduce inflammation. Cold should be applied for 10 to 15 minutes every 2 to 3 hours after any activity that aggravates your symptoms. Use ice packs or an ice massage. Place a towel or cloth in between your skin and the ice pack.   Medication:   Only take over-the-counter or prescription medicines for pain, discomfort, or fever as directed by your caregiver.   Pain relievers or muscle relaxants may be prescribed. Use only as directed and only as much as you need.   Change in the activity that caused the problem. This might include using a headset with a telephone so that the phone is not propped between your ear and shoulder.   Neck collar. Your caregiver may recommend temporary use of a soft cervical collar.   Work station. Changes may be needed in your work place. A better sitting position and/or better posture during work may be part of your treatment.   Physical Therapy. Your caregiver may recommend physical therapy. This can include instructions in the use of stretching and strengthening exercises. Improvement in   posture is important. Exercises and posture training can help stabilize the neck and strengthen muscles and keep symptoms from returning.  HOME CARE INSTRUCTIONS  Other than formal physical therapy, all treatments above can be done at home. Even when not at work, it is important to be conscious of your posture and of activities that can cause a return of symptoms. Most cervical sprains and/or strains are better in  1-3 weeks. As you improve and increase activities, doing a warm up and stretching before the activity will help prevent recurrent problems. SEEK MEDICAL CARE IF:   Pain is not effectively controlled with medication.   You feel unable to decrease pain medication over time as planned.   Activity level is not improving as planned and/or expected.  SEEK IMMEDIATE MEDICAL CARE IF:   While using medication, you develop any bleeding, stomach upset, or signs of an allergic reaction.   Symptoms get worse, become intolerable, and are not helped by medications.   New, unexplained symptoms develop.   You experience numbness, tingling, weakness, or paralysis of any part of your body.  MAKE SURE YOU:   Understand these instructions.   Will watch your condition.   Will get help right away if you are not doing well or get worse.  Document Released: 07/10/2007 Document Re-Released: 12/09/2008 [employer] Patient Information 2011 [employer]. 

## 2011-01-27 NOTE — Progress Notes (Signed)
  Subjective:    Patient ID: Diane Shaw, female    DOB: [birth year], 60 y.o.   MRN: [id number]  HPI  Pt here c/o L sided neck and shoulder pain.  Pt has hx old injury about 1-2 years ago--she fell on it but w/u then was neg.  Pt  Has recently started zumba with weights and pain is worse day after zumba.  No CP , sob etc.  Review of Systems As above    Objective:   Physical Exam  Constitutional: She is oriented to person, place, and time. She appears well-developed and well-nourished.  Pulmonary/Chest: Effort normal.  Musculoskeletal: Normal range of motion. She exhibits no edema.       + muscle spasms L trap No errythema or swelling  Neurological: She is alert and oriented to person, place, and time.  Psychiatric: She has a normal mood and affect. Her behavior is normal. Judgment and thought content normal.          Assessment & Plan:

## 2011-01-28 ENCOUNTER — Telehealth: Payer: Self-pay | Admitting: Family Medicine

## 2011-01-28 DIAGNOSIS — S139XXA Sprain of joints and ligaments of unspecified parts of neck, initial encounter: Secondary | ICD-10-CM

## 2011-01-28 MED ORDER — CYCLOBENZAPRINE HCL 10 MG PO TABS: 10.0000 mg | ORAL_TABLET | Freq: Three times a day (TID) | ORAL | Status: AC | PRN

## 2011-01-28 NOTE — Telephone Encounter (Signed)
Rx sent. Left detail message. 

## 2011-01-28 NOTE — Telephone Encounter (Signed)
Patient had appt [id number] rx for flexerill sent to [facility] - patient said pharmacy didn't get it

## 2011-08-15 ENCOUNTER — Encounter: Payer: Self-pay | Admitting: Family Medicine

## 2011-08-16 ENCOUNTER — Encounter: Payer: 59 | Admitting: Family Medicine

## 2011-08-30 DIAGNOSIS — R0602 Shortness of breath: Secondary | ICD-10-CM | POA: Insufficient documentation

## 2011-08-31 ENCOUNTER — Emergency Department (HOSPITAL_COMMUNITY)
Admission: EM | Admit: 2011-08-31 | Discharge: 2011-08-31 | Discharge status: Against Medical Advice | Payer: 59 | Attending: Emergency Medicine | Admitting: Emergency Medicine

## 2011-08-31 ENCOUNTER — Encounter (HOSPITAL_COMMUNITY): Payer: Self-pay | Admitting: *Deleted

## 2011-08-31 DIAGNOSIS — R0602 Shortness of breath: Secondary | ICD-10-CM

## 2011-08-31 MED ORDER — IPRATROPIUM BROMIDE 0.02 % IN SOLN
0.5000 mg | Freq: Once | RESPIRATORY_TRACT | Status: AC
  Administered 2011-08-31: 0.5 mg via RESPIRATORY_TRACT
  Filled 2011-08-31: qty 2.5

## 2011-08-31 MED ORDER — ALBUTEROL SULFATE (5 MG/ML) 0.5% IN NEBU
2.5000 mg | INHALATION_SOLUTION | Freq: Once | RESPIRATORY_TRACT | Status: AC
  Administered 2011-08-31: 2.5 mg via RESPIRATORY_TRACT
  Filled 2011-08-31: qty 0.5

## 2011-08-31 NOTE — ED Notes (Signed)
Pt reports having difficulty breathing, shortness of breath, productive cough, and bilateral wheezing. Pt went to Urgent Care today and was given rx for Z-Pack and cough medicine. Pt states that she still felt bad today, took two albuterol nebulizer treatments and used her albuterol inhaler with no relief. Pt came to the ER for further evaluation. Pt also reports being out of prednisone, which helps with her asthma. Pt actively coughing. Pt's bilateral lung sounds have inspiratory and expiratroty wheezing.

## 2011-08-31 NOTE — ED Provider Notes (Signed)
Patient left prior to being seen by M.D. I did not assess her during this visit  Olivia Mackie, MD 08/31/11 [phone]

## 2011-08-31 NOTE — ED Notes (Signed)
Patient didn't want to wait any longer dure to the fact that her ride needed to go to work.  Lung clear to ausc.  o2 sat 98%.  Dr Norlene Campbell made aware of patient's intentions.   Patient left ambulatory

## 2011-09-15 ENCOUNTER — Encounter: Payer: 59 | Admitting: Family Medicine

## 2011-10-17 ENCOUNTER — Encounter: Payer: Self-pay | Admitting: Family Medicine

## 2011-10-17 ENCOUNTER — Other Ambulatory Visit (HOSPITAL_COMMUNITY)
Admission: RE | Admit: 2011-10-17 | Discharge: 2011-10-17 | Disposition: A | Discharge status: Home / Self Care | Payer: 59 | Source: Ambulatory Visit | Attending: Family Medicine | Admitting: Family Medicine

## 2011-10-17 ENCOUNTER — Ambulatory Visit (INDEPENDENT_AMBULATORY_CARE_PROVIDER_SITE_OTHER): Payer: 59 | Admitting: Family Medicine

## 2011-10-17 VITALS — BP 140/80 | HR 79 | Temp 97.9°F | Ht 64.0 in | Wt 226.6 lb

## 2011-10-17 DIAGNOSIS — Z780 Asymptomatic menopausal state: Secondary | ICD-10-CM

## 2011-10-17 DIAGNOSIS — K649 Unspecified hemorrhoids: Secondary | ICD-10-CM

## 2011-10-17 DIAGNOSIS — E785 Hyperlipidemia, unspecified: Secondary | ICD-10-CM

## 2011-10-17 DIAGNOSIS — Z23 Encounter for immunization: Secondary | ICD-10-CM

## 2011-10-17 DIAGNOSIS — Z01419 Encounter for gynecological examination (general) (routine) without abnormal findings: Secondary | ICD-10-CM | POA: Insufficient documentation

## 2011-10-17 DIAGNOSIS — Z0000 Encounter for general adult medical examination without abnormal findings: Secondary | ICD-10-CM

## 2011-10-17 DIAGNOSIS — E669 Obesity, unspecified: Secondary | ICD-10-CM

## 2011-10-17 MED ORDER — HYDROCORTISONE ACETATE 25 MG RE SUPP: 25.0000 mg | Freq: Two times a day (BID) | RECTAL | Status: AC

## 2011-10-17 NOTE — Patient Instructions (Signed)

## 2011-10-17 NOTE — Progress Notes (Signed)
Subjective:     Diane Shaw is a 61 y.o. female and is here for a comprehensive physical exam. The patient reports no problems.  History   Social History  . Marital Status: Single    Spouse Name: N/A    Number of Children: N/A  . Years of Education: N/A   Occupational History  . Not on file.   Social History Main Topics  . Smoking status: Never Smoker   . Smokeless tobacco: Never Used  . Alcohol Use: 0.6 - 1.2 oz/week    1-2 Glasses of wine per week  . Drug Use: No  . Sexually Active: Yes -- Female partner(s)    Birth Control/ Protection: None   Other Topics Concern  . Not on file   Social History Narrative  . No narrative on file   Health Maintenance  Topic Date Due  . Pap Smear  04/15/1968  . Zostavax  04/15/2010  . Influenza Vaccine  06/26/2012  . Mammogram  11/04/2012  . Tetanus/tdap  04/09/2018  . Colonoscopy  10/16/2020    The following portions of the patient's history were reviewed and updated as appropriate: allergies, current medications, past family history, past medical history, past social history, past surgical history and problem list.  Review of System Review of Systems  Constitutional: Negative for activity change, appetite change and fatigue.  HENT: Negative for hearing loss, congestion, tinnitus and ear discharge.  dentist-- due Eyes: Negative for visual disturbance (see optho q1y -- vision corrected to 20/20 with glasses).  Respiratory: Negative for cough, chest tightness and shortness of breath.   Cardiovascular: Negative for chest pain, palpitations and leg swelling.  Gastrointestinal: Negative for abdominal pain, diarrhea, constipation and abdominal distention.  Genitourinary: Negative for urgency, frequency, decreased urine volume and difficulty urinating.  Musculoskeletal: Negative for back pain, arthralgias and gait problem.  Skin: Negative for color change, pallor and rash.  Neurological: Negative for dizziness, light-headedness, numbness  and headaches.  Hematological: Negative for adenopathy. Does not bruise/bleed easily.  Psychiatric/Behavioral: Negative for suicidal ideas, confusion, sleep disturbance, self-injury, dysphoric mood, decreased concentration and agitation.       Objective:    BP 140/80  Pulse 79  Temp(Src) 97.9 F (36.6 C) (Oral)  Ht 5\' 4"  (1.626 m)  Wt 226 lb 9.6 oz (102.785 kg)  BMI 38.90 kg/m2  SpO2 98%  General Appearance:    Alert, cooperative, no distress, appears stated age  Head:    Normocephalic, without obvious abnormality, atraumatic  Eyes:    PERRL, conjunctiva/corneas clear, EOM's intact, fundi    benign, both eyes  Ears:    Normal TM's and external ear canals, both ears  Nose:   Nares normal, septum midline, mucosa normal, no drainage    or sinus tenderness  Throat:   Lips, mucosa, and tongue normal; teeth and gums normal  Neck:   Supple, symmetrical, trachea midline, no adenopathy;    thyroid:  no enlargement/tenderness/nodules; no carotid   bruit or JVD  Back:     Symmetric, no curvature, ROM normal, no CVA tenderness  Lungs:     Clear to auscultation bilaterally, respirations unlabored  Chest Wall:    No tenderness or deformity   Heart:    Regular rate and rhythm, S1 and S2 normal, no murmur, rub   or gallop  Breast Exam:    No tenderness, masses, or nipple abnormality  Abdomen:     Soft, non-tender, bowel sounds active all four quadrants,    no masses,  no organomegaly  Genitalia:    Normal female without lesion, discharge or tenderness  Rectal:    Normal tone, pap done, no masses or tenderness;   guaiac negative stool  Extremities:   Extremities normal, atraumatic, no cyanosis or edema  Pulses:   2+ and symmetric all extremities  Skin:   Skin color, texture, turgor normal, no rashes or lesions  Lymph nodes:   Cervical, supraclavicular, and axillary nodes normal  Neurologic:   CNII-XII intact, normal strength, sensation and reflexes    throughout     Assessment:     Healthy female exam.     hyperlipidemia---  con't meds  asthma---stable Plan:  ghm utd Check labs   See After Visit Summary for Counseling Recommendations

## 2011-10-21 ENCOUNTER — Encounter: Payer: Self-pay | Admitting: *Deleted

## 2012-01-23 ENCOUNTER — Encounter: Payer: 59 | Attending: Family Medicine | Admitting: *Deleted

## 2012-01-23 ENCOUNTER — Encounter: Payer: Self-pay | Admitting: *Deleted

## 2012-01-23 VITALS — Ht 65.5 in | Wt 228.5 lb

## 2012-01-23 DIAGNOSIS — Z713 Dietary counseling and surveillance: Secondary | ICD-10-CM | POA: Insufficient documentation

## 2012-01-23 DIAGNOSIS — E785 Hyperlipidemia, unspecified: Secondary | ICD-10-CM | POA: Insufficient documentation

## 2012-01-23 DIAGNOSIS — E669 Obesity, unspecified: Secondary | ICD-10-CM

## 2012-01-23 NOTE — Patient Instructions (Signed)
Plan  Take a walk 15 minutes times 5 days a week Call Eber Jones to talk about exercise commitment [place] foods at home 2 nights a week Eat fried foods out 1 day a week Substitute frozen yogurt for ice cream at night Will eat cold cut sandwich at [place] 1 time a week and will eat leaner meat ([place] or chicken or roast beef) 1 time a week

## 2012-01-23 NOTE — Progress Notes (Signed)
  Medical Nutrition Therapy:  Appt start time: 1500 end time:  1600.   Assessment:  Primary concerns today: obesity and hyperlipidemia.   MEDICATIONS: see list     DIETARY INTAKE:  Usual eating pattern includes 3 meals and 2 snacks per day.  Everyday foods include fatty meats, vegetables, refined carbohydrates.  Avoided foods include eggs; salmon (but trying to eat more).    24-hr recall:  B ( AM): leftovers from dinner; peanut butter and jelly sandwich; sausage;  Snk ( AM): cereal and milk; cottage cheese and fruits; frozen dinner (marie calendar's)  L ( PM): subway (cold cut on wheat); mcdonald's(happy meal or burger and no fries) or wendy's (same as mcdonald's) Snk ( PM): not typically D ( PM): meat, starch, vegetables.  Meats typically fried. Snk ( PM): ice cream or cookies Beverages: water or regular soda  Usual physical activity: some housework, active with grand kids on weekends sometimes   Progress Towards Goal(s):  In progress.   Nutritional Diagnosis:  NI-5.6.2 Excessive fat intake As related to hyperlipidemia.  As evidenced by elevated blood lipid levels.    Intervention:  Nutrition counseling provided focusing on reduction in dietary fats and increased physical activity.  Plan  Take a walk 15 minutes times 5 days a week Call Eber Jones to talk about exercise commitment [place] foods at home 2 nights a week Eat fried foods out 1 day a week Substitute frozen yogurt for ice cream at night Will eat cold cut sandwich at [place] 1 time a week and will eat leaner meat ([place] or chicken or roast beef) 1 time a week  Handouts given during visit include:  Dest[facility] Heart Healthy Eating  Food label handout  Monitoring/Evaluation:  Dietary intake, exercise, reading food labels, and body weight in 4 week(s).

## 2012-01-26 ENCOUNTER — Emergency Department (HOSPITAL_COMMUNITY): Payer: 59

## 2012-01-26 ENCOUNTER — Encounter (HOSPITAL_COMMUNITY): Payer: Self-pay | Admitting: *Deleted

## 2012-01-26 ENCOUNTER — Inpatient Hospital Stay (HOSPITAL_COMMUNITY)
Admission: EM | Admit: 2012-01-26 | Discharge: 2012-01-30 | DRG: 203 | Disposition: A | Discharge status: Home / Self Care | Payer: 59 | Attending: Internal Medicine | Admitting: Internal Medicine

## 2012-01-26 DIAGNOSIS — M25519 Pain in unspecified shoulder: Secondary | ICD-10-CM

## 2012-01-26 DIAGNOSIS — Z780 Asymptomatic menopausal state: Secondary | ICD-10-CM

## 2012-01-26 DIAGNOSIS — S139XXA Sprain of joints and ligaments of unspecified parts of neck, initial encounter: Secondary | ICD-10-CM

## 2012-01-26 DIAGNOSIS — D72829 Elevated white blood cell count, unspecified: Secondary | ICD-10-CM | POA: Diagnosis not present

## 2012-01-26 DIAGNOSIS — J45909 Unspecified asthma, uncomplicated: Secondary | ICD-10-CM

## 2012-01-26 DIAGNOSIS — E669 Obesity, unspecified: Secondary | ICD-10-CM | POA: Diagnosis present

## 2012-01-26 DIAGNOSIS — R7309 Other abnormal glucose: Secondary | ICD-10-CM | POA: Diagnosis not present

## 2012-01-26 DIAGNOSIS — E876 Hypokalemia: Secondary | ICD-10-CM | POA: Diagnosis present

## 2012-01-26 DIAGNOSIS — E785 Hyperlipidemia, unspecified: Secondary | ICD-10-CM

## 2012-01-26 DIAGNOSIS — K921 Melena: Secondary | ICD-10-CM

## 2012-01-26 DIAGNOSIS — J45901 Unspecified asthma with (acute) exacerbation: Principal | ICD-10-CM

## 2012-01-26 DIAGNOSIS — R000 Tachycardia, unspecified: Secondary | ICD-10-CM | POA: Diagnosis present

## 2012-01-26 DIAGNOSIS — T380X5A Adverse effect of glucocorticoids and synthetic analogues, initial encounter: Secondary | ICD-10-CM | POA: Diagnosis not present

## 2012-01-26 MED ORDER — METHYLPREDNISOLONE SODIUM SUCC 125 MG IJ SOLR
INTRAMUSCULAR | Status: AC
  Administered 2012-01-26: 23:00:00
  Filled 2012-01-26: qty 2

## 2012-01-26 MED ORDER — ALBUTEROL (5 MG/ML) CONTINUOUS INHALATION SOLN
10.0000 mg/h | INHALATION_SOLUTION | Freq: Once | RESPIRATORY_TRACT | Status: AC
  Administered 2012-01-26: 10 mg/h via RESPIRATORY_TRACT

## 2012-01-26 MED ORDER — ALBUTEROL SULFATE (5 MG/ML) 0.5% IN NEBU
INHALATION_SOLUTION | RESPIRATORY_TRACT | Status: AC
  Administered 2012-01-26: 23:00:00
  Filled 2012-01-26: qty 1

## 2012-01-26 MED ORDER — IPRATROPIUM BROMIDE 0.02 % IN SOLN
RESPIRATORY_TRACT | Status: AC
  Administered 2012-01-26: 23:00:00
  Filled 2012-01-26: qty 2.5

## 2012-01-26 NOTE — ED Notes (Signed)
Per EMS: EMS called to pt's home for c/o shortness of breath. Pt states she started to have shortness of breath this am. EMS arrived to pt's house at 7 pm, EMS gave pt breathing treatment, pt refused transport to ED. Pt tried to go to bed became short of breath again, gave herself another dose of her inhaler with no relief. EMS called back to pts house at 10 pm. EMS reports diminished lung sound with wheezing in upper lobes. EMS gave pt another 5 mg of albuterol, 500 atrovent, 125 solu-medrol. Pt has not wheezing in upper lobes but wheezing in lower lobes per EMS. Pt is A&O x 4. Pt denies chest pain.

## 2012-01-26 NOTE — ED Notes (Signed)
[id number]<AV> Expected date:<BR> Expected time:10:48 PM<BR> Means of arrival:<BR> Comments:<BR> M231 - 60yoF SOB, neb by ems

## 2012-01-26 NOTE — Progress Notes (Signed)
Peak flow attempted x 3 Expected value was 502 L/m  120 L/m 120 L/m 140 L/m

## 2012-01-27 ENCOUNTER — Encounter (HOSPITAL_COMMUNITY): Payer: Self-pay | Admitting: Internal Medicine

## 2012-01-27 ENCOUNTER — Inpatient Hospital Stay (HOSPITAL_COMMUNITY): Payer: 59

## 2012-01-27 DIAGNOSIS — J45901 Unspecified asthma with (acute) exacerbation: Secondary | ICD-10-CM

## 2012-01-27 DIAGNOSIS — J984 Other disorders of lung: Secondary | ICD-10-CM

## 2012-01-27 DIAGNOSIS — J00 Acute nasopharyngitis [common cold]: Secondary | ICD-10-CM

## 2012-01-27 LAB — CBC
HCT: 36.8 % (ref 36.0–46.0)
Hemoglobin: 12.3 g/dL (ref 12.0–15.0)
MCH: 27.4 pg (ref 26.0–34.0)
MCH: 27.7 pg (ref 26.0–34.0)
MCHC: 33.1 g/dL (ref 30.0–36.0)
MCHC: 33.4 g/dL (ref 30.0–36.0)
Platelets: 293 10*3/uL (ref 150–400)
RDW: 13.6 % (ref 11.5–15.5)
RDW: 13.8 % (ref 11.5–15.5)

## 2012-01-27 LAB — DIFFERENTIAL
Basophils Absolute: 0 10*3/uL (ref 0.0–0.1)
Basophils Relative: 0 % (ref 0–1)
Eosinophils Absolute: 0 10*3/uL (ref 0.0–0.7)
Monocytes Absolute: 0.3 10*3/uL (ref 0.1–1.0)
Monocytes Relative: 2 % — ABNORMAL LOW (ref 3–12)
Neutrophils Relative %: 94 % — ABNORMAL HIGH (ref 43–77)

## 2012-01-27 LAB — BASIC METABOLIC PANEL
BUN: 10 mg/dL (ref 6–23)
BUN: 8 mg/dL (ref 6–23)
Calcium: 9.7 mg/dL (ref 8.4–10.5)
Creatinine, Ser: 0.4 mg/dL — ABNORMAL LOW (ref 0.50–1.10)
Creatinine, Ser: 0.46 mg/dL — ABNORMAL LOW (ref 0.50–1.10)
GFR calc Af Amer: >90 mL/min (ref >90)
GFR calc non Af Amer: >90 mL/min (ref >90)
GFR calc non Af Amer: >90 mL/min (ref >90)
Glucose, Bld: 189 mg/dL — ABNORMAL HIGH (ref 70–99)

## 2012-01-27 LAB — BLOOD GAS, ARTERIAL
Acid-Base Excess: 1.8 mmol/L (ref 0.0–2.0)
O2 Saturation: 96.2 %
Patient temperature: 37
pO2, Arterial: 76.2 mmHg — ABNORMAL LOW (ref 80.0–100.0)

## 2012-01-27 LAB — TSH: TSH: 0.376 u[IU]/mL (ref 0.350–4.500)

## 2012-01-27 MED ORDER — LISINOPRIL 40 MG PO TABS
40.0000 mg | ORAL_TABLET | Freq: Every day | ORAL | Status: DC
  Administered 2012-01-27 – 2012-01-30 (×4): 40 mg via ORAL
  Filled 2012-01-27 (×4): qty 1

## 2012-01-27 MED ORDER — INSULIN ASPART 100 UNIT/ML ~~LOC~~ SOLN
0.0000 [IU] | SUBCUTANEOUS | Status: DC
  Administered 2012-01-28 (×2): 1 [IU] via SUBCUTANEOUS
  Administered 2012-01-28: 2 [IU] via SUBCUTANEOUS
  Administered 2012-01-28: 3 [IU] via SUBCUTANEOUS
  Administered 2012-01-28: 2 [IU] via SUBCUTANEOUS
  Administered 2012-01-28 – 2012-01-29 (×4): 1 [IU] via SUBCUTANEOUS

## 2012-01-27 MED ORDER — ONDANSETRON HCL 4 MG/2ML IJ SOLN: 4.0000 mg | Freq: Four times a day (QID) | INTRAMUSCULAR | Status: DC | PRN

## 2012-01-27 MED ORDER — CYCLOBENZAPRINE HCL 10 MG PO TABS
10.0000 mg | ORAL_TABLET | Freq: Three times a day (TID) | ORAL | Status: DC | PRN
  Filled 2012-01-27: qty 1

## 2012-01-27 MED ORDER — ALBUTEROL SULFATE (5 MG/ML) 0.5% IN NEBU
2.5000 mg | INHALATION_SOLUTION | RESPIRATORY_TRACT | Status: DC
  Administered 2012-01-27 – 2012-01-28 (×7): 2.5 mg via RESPIRATORY_TRACT
  Filled 2012-01-27 (×6): qty 0.5

## 2012-01-27 MED ORDER — SODIUM CHLORIDE 0.9 % IV SOLN: 250.0000 mL | INTRAVENOUS | Status: DC | PRN

## 2012-01-27 MED ORDER — ALBUTEROL SULFATE (5 MG/ML) 0.5% IN NEBU
2.5000 mg | INHALATION_SOLUTION | RESPIRATORY_TRACT | Status: DC | PRN
  Administered 2012-01-27 (×2): 2.5 mg via RESPIRATORY_TRACT
  Filled 2012-01-27 (×4): qty 0.5

## 2012-01-27 MED ORDER — SODIUM CHLORIDE 0.9 % IJ SOLN
3.0000 mL | INTRAMUSCULAR | Status: DC | PRN
  Administered 2012-01-29: 3 mL via INTRAVENOUS

## 2012-01-27 MED ORDER — ONDANSETRON HCL 4 MG PO TABS: 4.0000 mg | ORAL_TABLET | Freq: Four times a day (QID) | ORAL | Status: DC | PRN

## 2012-01-27 MED ORDER — SODIUM CHLORIDE 0.9 % IJ SOLN
3.0000 mL | Freq: Two times a day (BID) | INTRAMUSCULAR | Status: DC
  Administered 2012-01-28 – 2012-01-29 (×2): 3 mL via INTRAVENOUS

## 2012-01-27 MED ORDER — MORPHINE SULFATE 2 MG/ML IJ SOLN
2.0000 mg | INTRAMUSCULAR | Status: DC | PRN
  Administered 2012-01-27: 2 mg via INTRAVENOUS
  Filled 2012-01-27: qty 1

## 2012-01-27 MED ORDER — METHYLPREDNISOLONE SODIUM SUCC 125 MG IJ SOLR
60.0000 mg | INTRAMUSCULAR | Status: DC
  Administered 2012-01-27 – 2012-01-28 (×7): 60 mg via INTRAVENOUS
  Filled 2012-01-27 (×4): qty 0.96
  Filled 2012-01-27: qty 2
  Filled 2012-01-27 (×9): qty 0.96

## 2012-01-27 MED ORDER — SIMVASTATIN 40 MG PO TABS
40.0000 mg | ORAL_TABLET | Freq: Every day | ORAL | Status: DC
  Administered 2012-01-27 – 2012-01-30 (×4): 40 mg via ORAL
  Filled 2012-01-27 (×4): qty 1

## 2012-01-27 MED ORDER — POTASSIUM CHLORIDE CRYS ER 20 MEQ PO TBCR
40.0000 meq | EXTENDED_RELEASE_TABLET | Freq: Every day | ORAL | Status: DC
  Administered 2012-01-27 – 2012-01-30 (×3): 40 meq via ORAL
  Filled 2012-01-27 (×5): qty 2

## 2012-01-27 MED ORDER — LORAZEPAM 2 MG/ML IJ SOLN
0.5000 mg | Freq: Three times a day (TID) | INTRAMUSCULAR | Status: DC | PRN
  Administered 2012-01-27 (×2): 0.5 mg via INTRAVENOUS
  Filled 2012-01-27 (×2): qty 1

## 2012-01-27 MED ORDER — ENOXAPARIN SODIUM 40 MG/0.4ML ~~LOC~~ SOLN
40.0000 mg | SUBCUTANEOUS | Status: DC
  Administered 2012-01-27 – 2012-01-30 (×4): 40 mg via SUBCUTANEOUS
  Filled 2012-01-27 (×5): qty 0.4

## 2012-01-27 MED ORDER — POTASSIUM CHLORIDE CRYS ER 20 MEQ PO TBCR
40.0000 meq | EXTENDED_RELEASE_TABLET | Freq: Two times a day (BID) | ORAL | Status: DC
  Administered 2012-01-27: 40 meq via ORAL
  Filled 2012-01-27 (×2): qty 2

## 2012-01-27 MED ORDER — IOHEXOL 300 MG/ML  SOLN
100.0000 mL | Freq: Once | INTRAMUSCULAR | Status: AC | PRN
  Administered 2012-01-27: 100 mL via INTRAVENOUS

## 2012-01-27 MED ORDER — MAGNESIUM SULFATE 40 MG/ML IJ SOLN
2.0000 g | Freq: Once | INTRAMUSCULAR | Status: AC
  Administered 2012-01-27: 2 g via INTRAVENOUS
  Filled 2012-01-27: qty 50

## 2012-01-27 MED ORDER — ZILEUTON 600 MG PO TABS
1200.0000 mg | ORAL_TABLET | Freq: Every day | ORAL | Status: DC
  Filled 2012-01-27 (×3): qty 2

## 2012-01-27 MED ORDER — CYANOCOBALAMIN 250 MCG PO TABS
250.0000 ug | ORAL_TABLET | Freq: Every day | ORAL | Status: DC
  Administered 2012-01-27 – 2012-01-30 (×4): 250 ug via ORAL
  Filled 2012-01-27 (×4): qty 1

## 2012-01-27 MED ORDER — DOCUSATE SODIUM 100 MG PO CAPS
100.0000 mg | ORAL_CAPSULE | Freq: Two times a day (BID) | ORAL | Status: DC
  Administered 2012-01-27 – 2012-01-30 (×8): 100 mg via ORAL
  Filled 2012-01-27 (×9): qty 1

## 2012-01-27 MED ORDER — LEVOFLOXACIN IN D5W 500 MG/100ML IV SOLN
500.0000 mg | INTRAVENOUS | Status: DC
  Administered 2012-01-27 – 2012-01-29 (×3): 500 mg via INTRAVENOUS
  Filled 2012-01-27 (×3): qty 100

## 2012-01-27 MED ORDER — SODIUM CHLORIDE 0.9 % IJ SOLN
3.0000 mL | Freq: Two times a day (BID) | INTRAMUSCULAR | Status: DC
  Administered 2012-01-28: 3 mL via INTRAVENOUS

## 2012-01-27 MED ORDER — ALBUTEROL SULFATE (5 MG/ML) 0.5% IN NEBU
2.5000 mg | INHALATION_SOLUTION | Freq: Four times a day (QID) | RESPIRATORY_TRACT | Status: DC
  Administered 2012-01-27 (×3): 2.5 mg via RESPIRATORY_TRACT
  Filled 2012-01-27 (×2): qty 0.5

## 2012-01-27 MED ORDER — HYDRALAZINE HCL 20 MG/ML IJ SOLN
10.0000 mg | Freq: Three times a day (TID) | INTRAMUSCULAR | Status: DC | PRN
  Administered 2012-01-27: 10 mg via INTRAVENOUS
  Filled 2012-01-27: qty 1

## 2012-01-27 MED ORDER — ASPIRIN EC 81 MG PO TBEC
81.0000 mg | DELAYED_RELEASE_TABLET | Freq: Every day | ORAL | Status: DC
  Administered 2012-01-27 – 2012-01-30 (×4): 81 mg via ORAL
  Filled 2012-01-27 (×4): qty 1

## 2012-01-27 MED ORDER — FLUTICASONE-SALMETEROL 250-50 MCG/DOSE IN AEPB
1.0000 | INHALATION_SPRAY | Freq: Two times a day (BID) | RESPIRATORY_TRACT | Status: DC
  Administered 2012-01-27 – 2012-01-30 (×7): 1 via RESPIRATORY_TRACT
  Filled 2012-01-27: qty 14

## 2012-01-27 NOTE — Progress Notes (Signed)
Patient ID: Diane Shaw, female   DOB: [birth year], 61 y.o.   MRN: [id number]  Subjective: No events overnight. Patient denies chest pain. Pt does reports only minimal improvement is shortness of breath.   Objective:  Vital signs in last 24 hours:  Filed Vitals:   01/27/12 0351 01/27/12 0405 01/27/12 0654 01/27/12 0741  BP: 182/95  173/95   Pulse: 113  110   Temp: 98.2 F (36.8 C)  98 F (36.7 C)   TempSrc: Oral  Oral   Resp: 21 24 20    Height: 5\' 5"  (1.651 m)     Weight: 99.791 kg (220 lb)     SpO2: 93%  94% 97%   Physical Exam: General: Alert, awake, oriented x3, in no acute distress. HEENT: No bruits, no goiter. Moist mucous membranes, no scleral icterus, no conjunctival pallor. Heart: Regular rhythm, tachycardic, S1/S2 +, no murmurs, rubs, gallops. Lungs: Clear to auscultation bilaterally. Expiratory wheezing, no rhonchi, no rales.  Abdomen: Soft, nontender, nondistended, positive bowel sounds. Extremities: No clubbing or cyanosis, no pitting edema,  positive pedal pulses. Neuro: Grossly nonfocal.  Lab Results:  Lab 01/27/12 0520 01/26/12 2330  WBC 15.2* 15.6*  HGB 12.1 12.3  HCT 36.6 36.8  PLT 293 274    Lab 01/27/12 0520 01/26/12 2330  NA 141 139  K 3.4* 3.2*  CL 101 100  CO2 23 23  GLUCOSE 189* 167*  BUN 8 10  CREATININE 0.40* 0.46*  CALCIUM 9.7 9.2   Studies/Results:  Dg Chest Port 1 View 01/26/2012     IMPRESSION:  No acute cardiopulmonary disease on this portable AP examination.    Medications: Scheduled Meds:   . albuterol  2.5 mg Nebulization Q6H  . albuterol      . albuterol  10 mg/hr Nebulization Once  . aspirin EC  81 mg Oral Daily  . docusate sodium  100 mg Oral BID  . enoxaparin  40 mg Subcutaneous Q24H  . Fluticasone-Salmeterol  1 puff Inhalation BID  . ipratropium      . levofloxacin (LEVAQUIN) IV  500 mg Intravenous Q24H  . magnesium sulfate 1 - 4 g bolus IVPB  2 g Intravenous Once  . methylPREDNISolone (SOLU-MEDROL) injection   60 mg Intravenous Q4H  . methylPREDNISolone sodium succinate      . potassium chloride  40 mEq Oral Daily  . simvastatin  40 mg Oral Daily  . sodium chloride  3 mL Intravenous Q12H  . sodium chloride  3 mL Intravenous Q12H  . vitamin B-12  250 mcg Oral Daily  . zileuton  1,200 mg Oral Daily   Continuous Infusions:  PRN Meds:.sodium chloride, albuterol, cyclobenzaprine, hydrALAZINE, morphine injection, ondansetron (ZOFRAN) IV, ondansetron, sodium chloride  Assessment/Plan:  Principal Problem:  *Asthma exacerbation - slight clinical improvement - CT angio negative for PE - follow upon ABG - continue to provide supportive care with nebulizer as needed and scheduled, antibiotics - monitor vitals per floor protocol  Active Problems:  Tachycardia - secondary to albuterol nebulizer - continue to monitor on telemetry for now   HYPERLIPIDEMIA - stable - continue statin   Hypokalemia - secondary to beta agonist use - supplement   Hyperglycemia - secondary to steroid use - monitor per protocol - start SSI - check A1C   Leukocytosis - secondary to steroid use - CBC in AM   EDUCATION - test results and diagnostic studies were discussed with patient  - patient verbalized the understanding - questions were answered at the bedside  and contact information was provided for additional questions or concerns   LOS: 1 day   Diane Shaw 01/27/2012, 8:05 AM  [facility] Pager: [phone]

## 2012-01-27 NOTE — H&P (Signed)
PCP:   Loreen Freud, DO, DO   Chief Complaint: Progressive shortness of breath and cough   HPI: Diane Shaw is an 61 y.o. female with history of asthma, never was a smoker, morbid obesity, who has been very compliant with her medication including several inhalers, presents to [facility] long emergency room severely short of breath. Originally she was having significant wheezing. She was given Solu-Medrol and one-hour nebulizer treatment in the emergency room with improvement of her symptoms. Further evaluation included a chest x-ray which was negative, leukocytosis with white count 15,000, potassium of 3.2, normal renal function tests and blood glucose 167. Hospitalist was asked to admit her for asthmatic bronchitis.  Rewiew of Systems:  The patient denies anorexia, fever, weight loss,, vision loss, decreased hearing, hoarseness, chest pain, syncope, , peripheral edema, balance deficits, hemoptysis, abdominal pain, melena, hematochezia, severe indigestion/heartburn, hematuria, incontinence, genital sores, muscle weakness, suspicious skin lesions, transient blindness, difficulty walking, depression, unusual weight change, abnormal bleeding, enlarged lymph nodes, angioedema, and breast masses.    Past Medical History  Diagnosis Date  . Asthma     History reviewed. No pertinent past surgical history.  Medications:  HOME MEDS: Prior to Admission medications   Medication Sig Start Date End Date Taking? Authorizing Provider  albuterol (PROVENTIL HFA) 108 (90 BASE) MCG/ACT inhaler Inhale 2 puffs into the lungs every 6 (six) hours as needed. For shortness of breath   Yes Historical Provider, MD  albuterol (PROVENTIL) (2.5 MG/3ML) 0.083% nebulizer solution Take 2.5 mg by nebulization every 6 (six) hours as needed. For shortness of breath   Yes Historical Provider, MD  atorvastatin (LIPITOR) 20 MG tablet Take 20 mg by mouth at bedtime.     Yes Historical Provider, MD  Fluticasone-Salmeterol  (ADVAIR DISKUS) 250-50 MCG/DOSE AEPB Inhale 1 puff into the lungs 2 (two) times daily.     Yes Historical Provider, MD  Multiple Vitamins-Minerals (MULTIVITAMINS THER. W/MINERALS) TABS Take 1 tablet by mouth daily.     Yes Historical Provider, MD  vitamin B-12 (CYANOCOBALAMIN) 250 MCG tablet Take 250 mcg by mouth daily.   Yes Historical Provider, MD  zileuton (ZYFLO CR) 600 MG CR tablet Take 1,200 mg by mouth daily.    Yes Historical Provider, MD  cyclobenzaprine (FLEXERIL) 10 MG tablet Take 1 tablet (10 mg total) by mouth every 8 (eight) hours as needed for muscle spasms. 01/28/11 01/28/12  Lelon Perla, DO     Allergies:  No Known Allergies  Social History:   reports that she has never smoked. She has never used smokeless tobacco. She reports that she drinks about .6 - 1.2 ounces of alcohol per week. She reports that she does not use illicit drugs.  Family History: Family History  Problem Relation Age of Onset  . Heart failure Sister   . Diabetes Sister   . Heart disease Sister     chf  . Diabetes Mother   . Hypertension Mother   . Heart disease Mother     chf  . Heart disease Father 49    MI  . Hypertension Father   . Heart disease Brother   . Arthritis Sister   . Heart disease Brother     Cabg     Physical Exam: Filed Vitals:   01/26/12 2320 01/26/12 2320 01/26/12 2340 01/27/12 0127  BP:  176/86  171/65  Pulse:  110  102  Temp:  97.9 F (36.6 C)    TempSrc:  Oral    Resp:  22  19  Height: 5\' 5"  (1.651 m)     Weight: 99.791 kg (220 lb)     SpO2:  95% 95% 97%   Blood pressure 171/65, pulse 102, temperature 97.9 F (36.6 C), temperature source Oral, resp. rate 19, height 5\' 5"  (1.651 m), weight 99.791 kg (220 lb), SpO2 97.00%.  GEN:  Pleasant person lying in the stretcher in no acute distress; cooperative with exam. She is able to complete full sentences PSYCH:  alert and oriented x4; does not appear anxious or depressed; affect is appropriate. HEENT: Mucous  membranes pink and anicteric; PERRLA; EOM intact; no cervical lymphadenopathy nor thyromegaly or carotid bruit; no JVD; Breasts:: Not examined CHEST WALL: No tenderness CHEST: Decreased breath sounds, tight wheezes HEART: Tachycardic; no murmurs rubs or gallops BACK: No kyphosis or scoliosis; no CVA tenderness ABDOMEN: Obese, soft non-tender; no masses, no organomegaly, normal abdominal bowel sounds; no pannus; no intertriginous candida. Rectal Exam: Not done EXTREMITIES: No bone or joint deformity; age-appropriate arthropathy of the hands and knees; no edema; no ulcerations. Genitalia: not examined PULSES: 2+ and symmetric SKIN: Normal hydration no rash or ulceration CNS: Cranial nerves 2-12 grossly intact no focal lateralizing neurologic deficit   Labs & Imaging Results for orders placed during the hospital encounter of 01/26/12 (from the past 48 hour(s))  CBC     Status: Abnormal   Collection Time   01/26/12 11:30 PM      Component Value Range Comment   WBC 15.6 (*) 4.0 - 10.5 (K/uL)    RBC 4.44  3.87 - 5.11 (MIL/uL)    Hemoglobin 12.3  12.0 - 15.0 (g/dL)    HCT 16.1  09.6 - 04.5 (%)    MCV 82.9  78.0 - 100.0 (fL)    MCH 27.7  26.0 - 34.0 (pg)    MCHC 33.4  30.0 - 36.0 (g/dL)    RDW 40.9  81.1 - 91.4 (%)    Platelets 274  150 - 400 (K/uL)   DIFFERENTIAL     Status: Abnormal   Collection Time   01/26/12 11:30 PM      Component Value Range Comment   Neutrophils Relative 94 (*) 43 - 77 (%)    Neutro Abs 14.6 (*) 1.7 - 7.7 (K/uL)    Lymphocytes Relative 4 (*) 12 - 46 (%)    Lymphs Abs 0.7  0.7 - 4.0 (K/uL)    Monocytes Relative 2 (*) 3 - 12 (%)    Monocytes Absolute 0.3  0.1 - 1.0 (K/uL)    Eosinophils Relative 0  0 - 5 (%)    Eosinophils Absolute 0.0  0.0 - 0.7 (K/uL)    Basophils Relative 0  0 - 1 (%)    Basophils Absolute 0.0  0.0 - 0.1 (K/uL)   BASIC METABOLIC PANEL     Status: Abnormal   Collection Time   01/26/12 11:30 PM      Component Value Range Comment   Sodium 139   135 - 145 (mEq/L)    Potassium 3.2 (*) 3.5 - 5.1 (mEq/L)    Chloride 100  96 - 112 (mEq/L)    CO2 23  19 - 32 (mEq/L)    Glucose, Bld 167 (*) 70 - 99 (mg/dL)    BUN 10  6 - 23 (mg/dL)    Creatinine, Ser 7.82 (*) 0.50 - 1.10 (mg/dL)    Calcium 9.2  8.4 - 10.5 (mg/dL)    GFR calc non Af Amer >90  >90 (mL/min)  GFR calc Af Amer >90  >90 (mL/min)    Dg Chest Port 1 View  01/26/2012  *RADIOLOGY REPORT*  Clinical Data: Asthma, extremely short of breath  PORTABLE CHEST - 1 VIEW  Comparison: 09/20/2009; 11/27/10/2006  Findings:  Grossly unchanged cardiac silhouette and mediastinal contours.  No focal airspace opacities.  No definite pleural effusion. Evaluation for pneumothorax is degraded secondary to overlying chin.  No acute osseous abnormalities.  IMPRESSION: No acute cardiopulmonary disease on this portable AP examination.  Original Report Authenticated By: Waynard Reeds, M.D.      Assessment Present on Admission:  .Asthma exacerbation .OBESITY .HYPERLIPIDEMIA   PLAN: Patient is having severe asthmatic bronchitis. She is doing better with nebulizer treatments. We'll admit her to telemetry. I will continue her IV steroids, nebulizer treatment, magnesium IV supplement, potassium supplement. We'll go ahead and give her Levaquin IV as well. She is stable, full code, and will be admitted to [facility] hospitalist service.   Other plans as per orders.    Chasin Findling 01/27/2012, 2:30 AM

## 2012-01-27 NOTE — ED Provider Notes (Signed)
History     CSN: [id number]  Arrival date & time 01/26/12  2254   First MD Initiated Contact with Patient 01/26/12 2305      Chief Complaint  Patient presents with  . Shortness of Breath    (Consider location/radiation/quality/duration/timing/severity/associated sxs/prior treatment) HPI 61 year old female presents to emergency department via EMS with report of shortness of breath. Patient with history of asthma. She reports she began to have nasal congestion earlier today, and today has become increasingly more congested and short of breath. She has taken multiple doses of her home nebulizer machine, although she does admit she does not take the full dose at a time. She reports these treatments have not improved her shortness of breath. EMS came to her house around 7 PM, and gave her treatment. She reports after that she was feeling much better and refused transport. Prior to going to bed, patient with onset of worsening shortness of breath. She has a cough. She denies any fever. She does not report any specific triggers for her asthma. She reports her last admission was about a year ago. She denies any previous ICU admissions or intubations. Patient is noted to be tripoding on the bed, but she denies being tired or fatigued from her work of breathing. Patient speaks in full sentences but is slightly breathless. Patient received albuterol Atrovent and Solu-Medrol  In route with EMS Past Medical History  Diagnosis Date  . Asthma     History reviewed. No pertinent past surgical history.  Family History  Problem Relation Age of Onset  . Heart failure Sister   . Diabetes Sister   . Heart disease Sister     chf  . Diabetes Mother   . Hypertension Mother   . Heart disease Mother     chf  . Heart disease Father 104    MI  . Hypertension Father   . Heart disease Brother   . Arthritis Sister   . Heart disease Brother     Cabg    History  Substance Use Topics  . Smoking status: Never  Smoker   . Smokeless tobacco: Never Used  . Alcohol Use: 0.6 - 1.2 oz/week    1-2 Glasses of wine per week    OB History    Grav Para Term Preterm Abortions TAB SAB Ect Mult Living   5 5 5       5       Review of Systems  All other systems reviewed and are negative.    Allergies  Review of patient's allergies indicates no known allergies.  Home Medications   Current Outpatient Rx  Name Route Sig Dispense Refill  . ALBUTEROL SULFATE HFA 108 (90 BASE) MCG/ACT IN AERS Inhalation Inhale 2 puffs into the lungs every 6 (six) hours as needed. For shortness of breath    . ALBUTEROL SULFATE (2.5 MG/3ML) 0.083% IN NEBU Nebulization Take 2.5 mg by nebulization every 6 (six) hours as needed. For shortness of breath    . ATORVASTATIN CALCIUM 20 MG PO TABS Oral Take 20 mg by mouth at bedtime.      Marland Kitchen FLUTICASONE-SALMETEROL 250-50 MCG/DOSE IN AEPB Inhalation Inhale 1 puff into the lungs 2 (two) times daily.      Carma Leaven M PLUS PO TABS Oral Take 1 tablet by mouth daily.      Marland Kitchen VITAMIN B-12 250 MCG PO TABS Oral Take 250 mcg by mouth daily.    Marland Kitchen ZILEUTON ER 600 MG PO TB12 Oral Take  1,200 mg by mouth daily.     . CYCLOBENZAPRINE HCL 10 MG PO TABS Oral Take 1 tablet (10 mg total) by mouth every 8 (eight) hours as needed for muscle spasms. 30 tablet 1    BP 176/86  Pulse 110  Temp(Src) 97.9 F (36.6 C) (Oral)  Resp 22  Ht 5\' 5"  (1.651 m)  Wt 220 lb (99.791 kg)  BMI 36.61 kg/m2  SpO2 95%  Physical Exam  Nursing note and vitals reviewed. Constitutional: She appears well-developed and well-nourished. She appears distressed.       Patient noted to be hypertensive, denies history of same in past  HENT:  Head: Normocephalic and atraumatic.  Eyes: Conjunctivae and EOM are normal. Pupils are equal, round, and reactive to light.  Neck: Normal range of motion. Neck supple. No JVD present. No tracheal deviation present. No thyromegaly present.  Cardiovascular: Regular rhythm and normal heart  sounds.  Exam reveals no gallop and no friction rub.   No murmur heard.      Tachycardia noted  Pulmonary/Chest: No stridor. She is in respiratory distress. She has wheezes. She has no rales. She exhibits no tenderness.       Patient in tripod position, decreased breath sounds throughout tachypnea, wheezing in all fields with poor air movement  Abdominal: Soft. Bowel sounds are normal. She exhibits no distension and no mass. There is no tenderness. There is no rebound and no guarding.  Musculoskeletal: Normal range of motion. She exhibits no edema and no tenderness.  Lymphadenopathy:    She has no cervical adenopathy.  Skin: Skin is warm and dry. No rash noted. No erythema. No pallor.    ED Course  Procedures (including critical care time)  Labs Reviewed  CBC - Abnormal; Notable for the following:    WBC 15.6 (*)    All other components within normal limits  DIFFERENTIAL - Abnormal; Notable for the following:    Neutrophils Relative 94 (*)    Neutro Abs 14.6 (*)    Lymphocytes Relative 4 (*)    Monocytes Relative 2 (*)    All other components within normal limits  BASIC METABOLIC PANEL - Abnormal; Notable for the following:    Potassium 3.2 (*)    Glucose, Bld 167 (*)    Creatinine, Ser 0.46 (*)    All other components within normal limits   Dg Chest Port 1 View  01/26/2012  *RADIOLOGY REPORT*  Clinical Data: Asthma, extremely short of breath  PORTABLE CHEST - 1 VIEW  Comparison: 09/20/2009; 11/27/10/2006  Findings:  Grossly unchanged cardiac silhouette and mediastinal contours.  No focal airspace opacities.  No definite pleural effusion. Evaluation for pneumothorax is degraded secondary to overlying chin.  No acute osseous abnormalities.  IMPRESSION: No acute cardiopulmonary disease on this portable AP examination.  Original Report Authenticated By: Waynard Reeds, M.D.     1. Asthma exacerbation       MDM  61 year old female with asthma exacerbation. Patient will need  admission given her multiple treatments at home and with EMS without significant improvement. We'll place on hour long continuous neb. Patient may require BiPAP if she has any further worsening in respiratory status or fatigue      12:54 AM Patient has completed hour long neb, still with diffuse wheezing, room air sats hovering low 90s to upper 80s. Patient does have improved lung sounds and better air movement. Will discuss with hospitalist for admission  Olivia Mackie, MD 01/27/12 [phone]

## 2012-01-28 DIAGNOSIS — J45901 Unspecified asthma with (acute) exacerbation: Secondary | ICD-10-CM

## 2012-01-28 DIAGNOSIS — J00 Acute nasopharyngitis [common cold]: Secondary | ICD-10-CM

## 2012-01-28 DIAGNOSIS — J984 Other disorders of lung: Secondary | ICD-10-CM

## 2012-01-28 LAB — GLUCOSE, CAPILLARY
Glucose-Capillary: 124 mg/dL — ABNORMAL HIGH (ref 70–99)
Glucose-Capillary: 144 mg/dL — ABNORMAL HIGH (ref 70–99)
Glucose-Capillary: 147 mg/dL — ABNORMAL HIGH (ref 70–99)
Glucose-Capillary: 149 mg/dL — ABNORMAL HIGH (ref 70–99)
Glucose-Capillary: 163 mg/dL — ABNORMAL HIGH (ref 70–99)

## 2012-01-28 LAB — BASIC METABOLIC PANEL
BUN: 19 mg/dL (ref 6–23)
Chloride: 103 mEq/L (ref 96–112)
Creatinine, Ser: 0.72 mg/dL (ref 0.50–1.10)
GFR calc Af Amer: >90 mL/min (ref >90)
Glucose, Bld: 165 mg/dL — ABNORMAL HIGH (ref 70–99)

## 2012-01-28 LAB — CBC
HCT: 34.2 % — ABNORMAL LOW (ref 36.0–46.0)
Hemoglobin: 11.3 g/dL — ABNORMAL LOW (ref 12.0–15.0)
MCV: 84.9 fL (ref 78.0–100.0)
RDW: 14.2 % (ref 11.5–15.5)
WBC: 20.2 10*3/uL — ABNORMAL HIGH (ref 4.0–10.5)

## 2012-01-28 LAB — PROCALCITONIN: Procalcitonin: <0.1 ng/mL

## 2012-01-28 MED ORDER — LEVALBUTEROL HCL 0.63 MG/3ML IN NEBU
0.6300 mg | INHALATION_SOLUTION | RESPIRATORY_TRACT | Status: DC | PRN
  Administered 2012-01-29: 0.63 mg via RESPIRATORY_TRACT
  Filled 2012-01-28 (×2): qty 3

## 2012-01-28 MED ORDER — METHYLPREDNISOLONE SODIUM SUCC 40 MG IJ SOLR
40.0000 mg | Freq: Two times a day (BID) | INTRAMUSCULAR | Status: DC
  Administered 2012-01-28 – 2012-01-29 (×2): 40 mg via INTRAVENOUS
  Filled 2012-01-28 (×4): qty 1

## 2012-01-28 MED ORDER — ZILEUTON 600 MG PO TABS: 1200.0000 mg | ORAL_TABLET | Freq: Every day | ORAL | Status: DC

## 2012-01-28 MED ORDER — LABETALOL HCL 100 MG PO TABS
100.0000 mg | ORAL_TABLET | Freq: Two times a day (BID) | ORAL | Status: DC
  Administered 2012-01-28 – 2012-01-30 (×5): 100 mg via ORAL
  Filled 2012-01-28 (×7): qty 1

## 2012-01-28 MED ORDER — LEVALBUTEROL HCL 0.63 MG/3ML IN NEBU
0.6300 mg | INHALATION_SOLUTION | Freq: Four times a day (QID) | RESPIRATORY_TRACT | Status: DC
  Administered 2012-01-28 – 2012-01-30 (×8): 0.63 mg via RESPIRATORY_TRACT
  Filled 2012-01-28 (×12): qty 3

## 2012-01-28 MED ORDER — LEVALBUTEROL HCL 0.63 MG/3ML IN NEBU
0.6300 mg | INHALATION_SOLUTION | RESPIRATORY_TRACT | Status: DC | PRN
  Filled 2012-01-28: qty 3

## 2012-01-28 MED ORDER — LORAZEPAM 2 MG/ML IJ SOLN
1.0000 mg | Freq: Three times a day (TID) | INTRAMUSCULAR | Status: DC | PRN
  Administered 2012-01-28 – 2012-01-29 (×3): 1 mg via INTRAVENOUS
  Filled 2012-01-28 (×4): qty 1

## 2012-01-28 NOTE — Progress Notes (Addendum)
Patient ID: Diane Shaw, female   DOB: [birth year], 61 y.o.   MRN: [id number]  Subjective: No events overnight. Patient denies chest pain, shortness of breath, abdominal pain.   Objective:  Vital signs in last 24 hours:  Filed Vitals:   01/27/12 2302 01/28/12 0345 01/28/12 0600 01/28/12 0754  BP:   125/68   Pulse:   95   Temp:   98 F (36.7 C)   TempSrc:   Oral   Resp: 18 18 20    Height:      Weight:      SpO2:   99% 97%    Intake/Output from previous day:  No intake or output data in the 24 hours ending 01/28/12 0925  Physical Exam: General: Alert, awake, oriented x3, in no acute distress. HEENT: No bruits, no goiter. Moist mucous membranes, no scleral icterus, no conjunctival pallor. Heart: Regular rate and rhythm, S1/S2 +, no murmurs, rubs, gallops. Lungs: Clear to auscultation bilaterally. Mild expiratory wheezing, no rhonchi, no rales.  Abdomen: Soft, nontender, nondistended, positive bowel sounds. Extremities: No clubbing or cyanosis, no pitting edema,  positive pedal pulses. Neuro: Grossly nonfocal.  Lab Results:  Lab 01/28/12 0619 01/27/12 0520 01/26/12 2330  WBC 20.2* 15.2* 15.6*  HGB 11.3* 12.1 12.3  HCT 34.2* 36.6 36.8  PLT 309 293 274    Lab 01/28/12 0619 01/27/12 0520 01/26/12 2330  NA 141 141 139  K 4.3 3.4* 3.2*  CL 103 101 100  CO2 27 23 23   GLUCOSE 165* 189* 167*  BUN 19 8 10   CREATININE 0.72 0.40* 0.46*  CALCIUM 9.4 9.7 9.2   Studies/Results: Ct Angio Chest W/cm &/or Wo Cm 01/27/2012    IMPRESSION:  1.  Study is exceedingly limited by extensive patient respiratory motion for assessment of pulmonary embolism.  No large central pulmonary embolus identified.  2.  Findings, as above, consistent with tracheobronchial malacia.    Dg Chest Port 1 View 01/26/2012    IMPRESSION:  No acute cardiopulmonary disease on this portable AP examination.   Medications: Scheduled Meds:   . albuterol  2.5 mg Nebulization Q4H  . aspirin EC  81 mg Oral  Daily  . docusate sodium  100 mg Oral BID  . enoxaparin  40 mg Subcutaneous Q24H  . Fluticasone-Salmeterol  1 puff Inhalation BID  . insulin aspart  0-9 Units Subcutaneous Q4H  . levofloxacin IV  500 mg Intravenous Q24H  . lisinopril  40 mg Oral Daily  . solumedrol  60 mg Intravenous Q4H  . potassium chloride  40 mEq Oral Daily  . potassium chloride  40 mEq Oral BID  . simvastatin  40 mg Oral Daily  . sodium chloride  3 mL Intravenous Q12H  . sodium chloride  3 mL Intravenous Q12H  . vitamin B-12  250 mcg Oral Daily  . zileuton  1,200 mg Oral Daily   Continuous Infusions:  PRN Meds:.sodium chloride, albuterol, cyclobenzaprine, hydrALAZINE, iohexol, LORazepam, morphine injection, ondansetron (ZOFRAN) IV, ondansetron, sodium chloride  Assessment/Plan: Principal Problem:  *Asthma exacerbation  - clinical improvement but still wheezing on exam even though improved - CT angio negative for PE - continue to provide supportive care with nebulizer as needed and scheduled, antibiotics  - monitor vitals per floor protocol   Active Problems:  Tachycardia  - secondary to albuterol nebulizer  - continue to monitor on telemetry for now   HYPERLIPIDEMIA  - stable  - continue statin   Hypokalemia  - secondary to beta agonist use  -  supplemented - BMP in AM  Hyperglycemia  - secondary to steroid use  - monitor per protocol  - continue SSI - A1C within normal limits  Leukocytosis  - secondary to steroid use  - CBC in AM   EDUCATION  - test results and diagnostic studies were discussed with patient  - patient verbalized the understanding  - questions were answered at the bedside and contact information was provided for additional questions or concerns    LOS: 2 days   Diane Shaw 01/28/2012, 9:25 AM  [facility] Pager: [phone]

## 2012-01-29 DIAGNOSIS — J984 Other disorders of lung: Secondary | ICD-10-CM

## 2012-01-29 DIAGNOSIS — J45901 Unspecified asthma with (acute) exacerbation: Secondary | ICD-10-CM

## 2012-01-29 DIAGNOSIS — J00 Acute nasopharyngitis [common cold]: Secondary | ICD-10-CM

## 2012-01-29 LAB — CBC
Hemoglobin: 11.4 g/dL — ABNORMAL LOW (ref 12.0–15.0)
MCH: 28 pg (ref 26.0–34.0)
MCHC: 32.5 g/dL (ref 30.0–36.0)
Platelets: 290 10*3/uL (ref 150–400)
RDW: 14.4 % (ref 11.5–15.5)

## 2012-01-29 LAB — BASIC METABOLIC PANEL
Calcium: 9.3 mg/dL (ref 8.4–10.5)
GFR calc Af Amer: >90 mL/min (ref >90)
GFR calc non Af Amer: >90 mL/min (ref >90)
Potassium: 4.8 mEq/L (ref 3.5–5.1)
Sodium: 140 mEq/L (ref 135–145)

## 2012-01-29 LAB — GLUCOSE, CAPILLARY
Glucose-Capillary: 103 mg/dL — ABNORMAL HIGH (ref 70–99)
Glucose-Capillary: 130 mg/dL — ABNORMAL HIGH (ref 70–99)
Glucose-Capillary: 135 mg/dL — ABNORMAL HIGH (ref 70–99)

## 2012-01-29 MED ORDER — LEVOFLOXACIN 500 MG PO TABS
500.0000 mg | ORAL_TABLET | Freq: Every day | ORAL | Status: DC
  Administered 2012-01-29: 500 mg via ORAL
  Filled 2012-01-29 (×2): qty 1

## 2012-01-29 MED ORDER — HYDROCOD POLST-CHLORPHEN POLST 10-8 MG/5ML PO LQCR
5.0000 mL | Freq: Two times a day (BID) | ORAL | Status: DC | PRN
  Administered 2012-01-29 – 2012-01-30 (×2): 5 mL via ORAL
  Filled 2012-01-29 (×2): qty 5

## 2012-01-29 MED ORDER — PREDNISONE 50 MG PO TABS
50.0000 mg | ORAL_TABLET | Freq: Every day | ORAL | Status: DC
  Administered 2012-01-30: 50 mg via ORAL
  Filled 2012-01-29 (×2): qty 1

## 2012-01-29 NOTE — Progress Notes (Signed)
PHARMACIST - PHYSICIAN COMMUNICATION DR:   Izola Price CONCERNING: Antibiotic IV to Oral Route Change Policy  RECOMMENDATION: This patient is receiving Levaquin by the intravenous route.  Based on criteria approved by the Pharmacy and Therapeutics Committee, the antibiotic(s) is/are being converted to the equivalent oral dose form(s).   DESCRIPTION: These criteria include:  Patient being treated for a respiratory tract infection, urinary tract infection, or cellulitis  The patient is not neutropenic and does not exhibit a GI malabsorption state  The patient is eating (either orally or via tube) and/or has been taking other orally administered medications for a least 24 hours  The patient is improving clinically and has a Tmax < 100.5  If you have questions about this conversion, please contact the Pharmacy Department  []   [phone] )  [facility] []   [phone] )  [facility]  []   [phone] )  [facility] [x]   [phone] )  [facility]   Thank you

## 2012-01-29 NOTE — Progress Notes (Signed)
Patient ID: Diane Shaw, female   DOB: [birth year], 61 y.o.   MRN: [id number]  Subjective: No events overnight. Patient denies chest pain, shortness of breath, abdominal pain. Reports feeling better.  Objective:  Vital signs in last 24 hours:  Filed Vitals:   01/28/12 2026 01/29/12 0141 01/29/12 0530 01/29/12 0750  BP: 154/85  132/80   Pulse: 120  78   Temp: 97.8 F (36.6 C)  97.6 F (36.4 C)   TempSrc: Oral  Oral   Resp: 22  22   Height:      Weight:      SpO2: 97% 96% 98% 85%    Intake/Output from previous day:   Intake/Output Summary (Last 24 hours) at 01/29/12 0907 Last data filed at 01/29/12 0819  Gross per 24 hour  Intake    840 ml  Output    750 ml  Net     90 ml    Physical Exam: General: Alert, awake, oriented x3, in no acute distress. HEENT: No bruits, no goiter. Moist mucous membranes, no scleral icterus, no conjunctival pallor. Heart: Regular rate and rhythm, S1/S2 +, no murmurs, rubs, gallops. Lungs: Clear to auscultation bilaterally. No wheezing, no rhonchi, no rales.  Abdomen: Soft, nontender, nondistended, positive bowel sounds. Extremities: No clubbing or cyanosis, no pitting edema,  positive pedal pulses. Neuro: Grossly nonfocal.  Lab Results:  Lab 01/29/12 0550 01/28/12 0619 01/27/12 0520 01/26/12 2330  WBC 20.0* 20.2* 15.2* 15.6*  HGB 11.4* 11.3* 12.1 12.3  HCT 35.1* 34.2* 36.6 36.8  PLT 290 309 293 274    Lab 01/29/12 0550 01/28/12 0619 01/27/12 0520 01/26/12 2330  NA 140 141 141 139  K 4.8 4.3 3.4* 3.2*  CL 103 103 101 100  CO2 27 27 23 23   GLUCOSE 112* 165* 189* 167*  BUN 19 19 8 10   CREATININE 0.61 0.72 0.40* 0.46*  CALCIUM 9.3 9.4 9.7 9.2   Studies/Results: Ct Angio Chest W/cm &/or Wo Cm 01/27/2012   IMPRESSION:  1.  Study is exceedingly limited by extensive patient respiratory motion for assessment of pulmonary embolism.  No large central pulmonary embolus identified.  2.  Findings, as above, consistent with tracheobronchial  malacia.      Medications: Scheduled Meds:   . aspirin EC  81 mg Oral Daily  . docusate sodium  100 mg Oral BID  . enoxaparin  40 mg Subcutaneous Q24H  . Fluticasone-Salmeterol  1 puff Inhalation BID  . insulin aspart  0-9 Units Subcutaneous Q4H  . labetalol  100 mg Oral BID  . levalbuterol  0.63 mg Nebulization Q6H  . levofloxacin IV  500 mg Intravenous Q24H  . lisinopril  40 mg Oral Daily  . potassium chloride  40 mEq Oral Daily  . predniSONE  50 mg Oral Q breakfast  . simvastatin  40 mg Oral Daily  . sodium chloride  3 mL Intravenous Q12H  . sodium chloride  3 mL Intravenous Q12H  . vitamin B-12  250 mcg Oral Daily  . zileuton  1,200 mg Oral Daily  . DISCONTD: albuterol  2.5 mg Nebulization Q4H   Continuous Infusions:  PRN Meds:.sodium chloride, cyclobenzaprine, hydrALAZINE, levalbuterol, LORazepam, morphine injection, ondansetron (ZOFRAN) IV, ondansetron, sodium chloride, DISCONTD: albuterol, DISCONTD: levalbuterol, DISCONTD: LORazepam  Assessment/Plan:  Principal Problem:  *Asthma exacerbation  - clinical improvement an no wheezing on exam  - CT angio negative for PE  - continue to provide supportive care with nebulizer as needed and scheduled, antibiotics  - monitor  vitals per floor protocol   Active Problems:  Tachycardia  - secondary to albuterol nebulizer  - labetalol started and albuterol scheduled discontinued - continue to monitor on telemetry for now   HYPERLIPIDEMIA  - stable  - continue statin   Hypokalemia  - secondary to beta agonist use  - supplemented  - BMP in AM   Hyperglycemia  - secondary to steroid use  - monitor per protocol  - continue SSI  - A1C within normal limits   Leukocytosis  - secondary to steroid use  - d/c solumedrol and start prednisone - CBC in AM   EDUCATION  - test results and diagnostic studies were discussed with patient  - patient verbalized the understanding  - questions were answered at the bedside and  contact information was provided for additional questions or concerns    LOS: 3 days   Diane Shaw 01/29/2012, 9:07 AM  [facility] Pager: [phone]

## 2012-01-30 DIAGNOSIS — J45901 Unspecified asthma with (acute) exacerbation: Secondary | ICD-10-CM

## 2012-01-30 DIAGNOSIS — J984 Other disorders of lung: Secondary | ICD-10-CM

## 2012-01-30 DIAGNOSIS — J00 Acute nasopharyngitis [common cold]: Secondary | ICD-10-CM

## 2012-01-30 LAB — CBC
MCH: 27.2 pg (ref 26.0–34.0)
MCHC: 31.5 g/dL (ref 30.0–36.0)
RDW: 14.2 % (ref 11.5–15.5)

## 2012-01-30 LAB — BASIC METABOLIC PANEL
BUN: 19 mg/dL (ref 6–23)
Calcium: 8.9 mg/dL (ref 8.4–10.5)
GFR calc Af Amer: >90 mL/min (ref >90)
GFR calc non Af Amer: >90 mL/min (ref >90)
Potassium: 4.3 mEq/L (ref 3.5–5.1)
Sodium: 142 mEq/L (ref 135–145)

## 2012-01-30 LAB — GLUCOSE, CAPILLARY

## 2012-01-30 MED ORDER — LEVOFLOXACIN 500 MG PO TABS: 500.0000 mg | ORAL_TABLET | Freq: Every day | ORAL | Status: AC

## 2012-01-30 MED ORDER — ALBUTEROL SULFATE HFA 108 (90 BASE) MCG/ACT IN AERS: 2.0000 | INHALATION_SPRAY | Freq: Four times a day (QID) | RESPIRATORY_TRACT | Status: DC | PRN

## 2012-01-30 MED ORDER — LABETALOL HCL 100 MG PO TABS: 100.0000 mg | ORAL_TABLET | Freq: Every day | ORAL | Status: DC

## 2012-01-30 MED ORDER — ALBUTEROL SULFATE (2.5 MG/3ML) 0.083% IN NEBU: 2.5000 mg | INHALATION_SOLUTION | Freq: Four times a day (QID) | RESPIRATORY_TRACT | Status: DC | PRN

## 2012-01-30 MED ORDER — HYDROCOD POLST-CHLORPHEN POLST 10-8 MG/5ML PO LQCR: 5.0000 mL | Freq: Two times a day (BID) | ORAL | Status: DC | PRN

## 2012-01-30 MED ORDER — FLUTICASONE-SALMETEROL 250-50 MCG/DOSE IN AEPB: 1.0000 | INHALATION_SPRAY | Freq: Two times a day (BID) | RESPIRATORY_TRACT | Status: DC

## 2012-01-30 MED ORDER — PREDNISONE 50 MG PO TABS: ORAL_TABLET | ORAL | Status: AC

## 2012-01-30 MED ORDER — LORAZEPAM 0.5 MG PO TABS: 0.5000 mg | ORAL_TABLET | Freq: Three times a day (TID) | ORAL | Status: AC

## 2012-01-30 NOTE — Progress Notes (Signed)
UR completed 

## 2012-01-30 NOTE — Discharge Instructions (Signed)
Asthma Attack Prevention HOW CAN ASTHMA BE PREVENTED? Currently, there is no way to prevent asthma from starting. However, you can take steps to control the disease and prevent its symptoms after you have been diagnosed. Learn about your asthma and how to control it. Take an active role to control your asthma by working with your caregiver to create and follow an asthma action plan. An asthma action plan guides you in taking your medicines properly, avoiding factors that make your asthma worse, tracking your level of asthma control, responding to worsening asthma, and seeking emergency care when needed. To track your asthma, keep records of your symptoms, check your peak flow number using a peak flow meter (handheld device that shows how well air moves out of your lungs), and get regular asthma checkups.  Other ways to prevent asthma attacks include:  Use medicines as your caregiver directs.   Identify and avoid things that make your asthma worse (as much as you can).   Keep track of your asthma symptoms and level of control.   Get regular checkups for your asthma.   With your caregiver, write a detailed plan for taking medicines and managing an asthma attack. Then be sure to follow your action plan. Asthma is an ongoing condition that needs regular monitoring and treatment.   Identify and avoid asthma triggers. A number of outdoor allergens and irritants (pollen, mold, cold air, air pollution) can trigger asthma attacks. Find out what causes or makes your asthma worse, and take steps to avoid those triggers (see below).   Monitor your breathing. Learn to recognize warning signs of an attack, such as slight coughing, wheezing or shortness of breath. However, your lung function may already decrease before you notice any signs or symptoms, so regularly measure and record your peak airflow with a home peak flow meter.   Identify and treat attacks early. If you act quickly, you're less likely to have  a severe attack. You will also need less medicine to control your symptoms. When your peak flow measurements decrease and alert you to an upcoming attack, take your medicine as instructed, and immediately stop any activity that may have triggered the attack. If your symptoms do not improve, get medical help.   Pay attention to increasing quick-relief inhaler use. If you find yourself relying on your quick-relief inhaler (such as albuterol), your asthma is not under control. See your caregiver about adjusting your treatment.  IDENTIFY AND CONTROL FACTORS THAT MAKE YOUR ASTHMA WORSE A number of common things can set off or make your asthma symptoms worse (asthma triggers). Keep track of your asthma symptoms for several weeks, detailing all the environmental and emotional factors that are linked with your asthma. When you have an asthma attack, go back to your asthma diary to see which factor, or combination of factors, might have contributed to it. Once you know what these factors are, you can take steps to control many of them.  Allergies: If you have allergies and asthma, it is important to take asthma prevention steps at home. Asthma attacks (worsening of asthma symptoms) can be triggered by allergies, which can cause temporary increased inflammation of your airways. Minimizing contact with the substance to which you are allergic will help prevent an asthma attack. Animal Dander:   Some people are allergic to the flakes of skin or dried saliva from animals with fur or feathers. Keep these pets out of your home.   If you can't keep a pet outdoors, keep the   pet out of your bedroom and other sleeping areas at all times, and keep the door closed.   Remove carpets and furniture covered with cloth from your home. If that is not possible, keep the pet away from fabric-covered furniture and carpets.  Dust Mites:  Many people with asthma are allergic to dust mites. Dust mites are tiny bugs that are found in  every home, in mattresses, pillows, carpets, fabric-covered furniture, bedcovers, clothes, stuffed toys, fabric, and other fabric-covered items.   Cover your mattress in a special dust-proof cover.   Cover your pillow in a special dust-proof cover, or wash the pillow each week in hot water. Water must be hotter than 130 F to kill dust mites. Cold or warm water used with detergent and bleach can also be effective.   Wash the sheets and blankets on your bed each week in hot water.   Try not to sleep or lie on cloth-covered cushions.   Call ahead when traveling and ask for a smoke-free hotel room. Bring your own bedding and pillows, in case the hotel only supplies feather pillows and down comforters, which may contain dust mites and cause asthma symptoms.   Remove carpets from your bedroom and those laid on concrete, if you can.   Keep stuffed toys out of the bed, or wash the toys weekly in hot water or cooler water with detergent and bleach.  Cockroaches:  Many people with asthma are allergic to the droppings and remains of cockroaches.   Keep food and garbage in closed containers. Never leave food out.   Use poison baits, traps, powders, gels, or paste (for example, boric acid).   If a spray is used to kill cockroaches, stay out of the room until the odor goes away.  Indoor Mold:  Fix leaky faucets, pipes, or other sources of water that have mold around them.   Clean moldy surfaces with a cleaner that has bleach in it.  Pollen and Outdoor Mold:  When pollen or mold spore counts are high, try to keep your windows closed.   Stay indoors with windows closed from late morning to afternoon, if you can. Pollen and some mold spore counts are highest at that time.   Ask your caregiver whether you need to take or increase anti-inflammatory medicine before your allergy season starts.  Irritants:   Tobacco smoke is an irritant. If you smoke, ask your caregiver how you can quit. Ask family  members to quit smoking, too. Do not allow smoking in your home or car.   If possible, do not use a wood-burning stove, kerosene heater, or fireplace. Minimize exposure to all sources of smoke, including incense, candles, fires, and fireworks.   Try to stay away from strong odors and sprays, such as perfume, talcum powder, hair spray, and paints.   Decrease humidity in your home and use an indoor air cleaning device. Reduce indoor humidity to below 60 percent. Dehumidifiers or central air conditioners can do this.   Try to have someone else vacuum for you once or twice a week, if you can. Stay out of rooms while they are being vacuumed and for a short while afterward.   If you vacuum, use a dust mask from a hardware store, a double-layered or microfilter vacuum cleaner bag, or a vacuum cleaner with a HEPA filter.   Sulfites in foods and beverages can be irritants. Do not drink beer or wine, or eat dried fruit, processed potatoes, or shrimp if they cause asthma   symptoms.   Cold air can trigger an asthma attack. Cover your nose and mouth with a scarf on cold or windy days.   Several health conditions can make asthma more difficult to manage, including runny nose, sinus infections, reflux disease, psychological stress, and sleep apnea. Your caregiver will treat these conditions, as well.   Avoid close contact with people who have a cold or the flu, since your asthma symptoms may get worse if you catch the infection from them. Wash your hands thoroughly after touching items that may have been handled by people with a respiratory infection.   Get a flu shot every year to protect against the flu virus, which often makes asthma worse for days or weeks. Also get a pneumonia shot once every five to 10 years.  Drugs:  Aspirin and other painkillers can cause asthma attacks. 10% to 20% of people with asthma have sensitivity to aspirin or a group of painkillers called non-steroidal anti-inflammatory drugs  (NSAIDS), such as ibuprofen and naproxen. These drugs are used to treat pain and reduce fevers. Asthma attacks caused by any of these medicines can be severe and even fatal. These drugs must be avoided in people who have known aspirin sensitive asthma. Products with acetaminophen are considered safe for people who have asthma. It is important that people with aspirin sensitivity read labels of all over-the-counter drugs used to treat pain, colds, coughs, and fever.   Beta blockers and ACE inhibitors are other drugs which you should discuss with your caregiver, in relation to your asthma.  ALLERGY SKIN TESTING  Ask your asthma caregiver about allergy skin testing or blood testing (RAST test) to identify the allergens to which you are sensitive. If you are found to have allergies, allergy shots (immunotherapy) for asthma may help prevent future allergies and asthma. With allergy shots, small doses of allergens (substances to which you are allergic) are injected under your skin on a regular schedule. Over a period of time, your body may become used to the allergen and less responsive with asthma symptoms. You can also take measures to minimize your exposure to those allergens. EXERCISE  If you have exercise-induced asthma, or are planning vigorous exercise, or exercise in cold, humid, or dry environments, prevent exercise-induced asthma by following your caregiver's advice regarding asthma treatment before exercising. Document Released: 08/31/2009 Document Revised: 09/01/2011 Document Reviewed: 08/31/2009 [employer] Patient Information 2012 [employer]. 

## 2012-01-30 NOTE — Discharge Summary (Signed)
Patient ID: Diane Shaw MRN: [id number] DOB/AGE: [birth year] 61 y.o.  Admit date: 01/26/2012 Discharge date: 01/30/2012  Primary Care Physician:  Loreen Freud, DO, DO  Discharge Diagnoses:  Asthma exacerbation  Present on Admission:  .Asthma exacerbation .OBESITY .HYPERLIPIDEMIA  Principal Problem:  *Asthma exacerbation Active Problems:  OBESITY  HYPERLIPIDEMIA   Medication List  As of 01/30/2012  6:45 PM   STOP taking these medications         cyclobenzaprine 10 MG tablet         TAKE these medications         albuterol 108 (90 BASE) MCG/ACT inhaler   Commonly known as: PROVENTIL HFA;VENTOLIN HFA   Inhale 2 puffs into the lungs every 6 (six) hours as needed. For shortness of breath      albuterol (2.5 MG/3ML) 0.083% nebulizer solution   Commonly known as: PROVENTIL   Take 3 mLs (2.5 mg total) by nebulization every 6 (six) hours as needed. For shortness of breath      atorvastatin 20 MG tablet   Commonly known as: LIPITOR   Take 20 mg by mouth at bedtime.      chlorpheniramine-HYDROcodone 10-8 MG/5ML Lqcr   Commonly known as: TUSSIONEX   Take 5 mLs by mouth every 12 (twelve) hours as needed.      ADVAIR DISKUS 250-50 MCG/DOSE Aepb   Generic drug: Fluticasone-Salmeterol   Inhale 1 puff into the lungs 2 (two) times daily.      Fluticasone-Salmeterol 250-50 MCG/DOSE Aepb   Commonly known as: ADVAIR   Inhale 1 puff into the lungs 2 (two) times daily.      labetalol 100 MG tablet   Commonly known as: NORMODYNE   Take 1 tablet (100 mg total) by mouth daily.      levofloxacin 500 MG tablet   Commonly known as: LEVAQUIN   Take 1 tablet (500 mg total) by mouth at bedtime.      LORazepam 0.5 MG tablet   Commonly known as: ATIVAN   Take 1 tablet (0.5 mg total) by mouth every 8 (eight) hours.      multivitamins ther. w/minerals Tabs   Take 1 tablet by mouth daily.      predniSONE 50 MG tablet   Commonly known as: DELTASONE   Take 60 mg tablet today, taper  down by 10 mg daily until completed      vitamin B-12 250 MCG tablet   Commonly known as: CYANOCOBALAMIN   Take 250 mcg by mouth daily.      ZYFLO CR 600 MG CR tablet   Generic drug: zileuton   Take 1,200 mg by mouth daily.            Disposition and Follow-up: With PCP in 2 - 3 weeks.  Consults: none  Significant Diagnostic Studies:   Ct Angio Chest W/cm &/or Wo Cm 01/27/2012   IMPRESSION:  1.  Study is exceedingly limited by extensive patient respiratory motion for assessment of pulmonary embolism.  No large central pulmonary embolus identified.  2.  Findings, as above, consistent with tracheobronchial malacia.   Dg Chest Port 1 View 01/26/2012   IMPRESSION:  No acute cardiopulmonary disease on this portable AP examination.   Brief H and P: Diane Shaw is an 61 y.o. female with history of asthma, never was a smoker, morbid obesity, who has been very compliant with her medication including several inhalers, presents to [facility] long emergency room severely short of breath. Originally she was having  significant wheezing. She was given Solu-Medrol and one-hour nebulizer treatment in the emergency room with improvement of her symptoms. Further evaluation included a chest x-ray which was negative, leukocytosis with white count 15,000, potassium of 3.2, normal renal function tests and blood glucose 167. Hospitalist was asked to admit her for asthmatic bronchitis.  Physical Exam on Discharge:  Filed Vitals:   01/29/12 2107 01/30/12 0156 01/30/12 0520 01/30/12 0846  BP: 148/84  131/82   Pulse: 94  70   Temp: 98 F (36.7 C)  98.1 F (36.7 C)   SpO2: 99% 98% 100% 96%    Intake/Output Summary (Last 24 hours) at 01/30/12 1845 Last data filed at 01/30/12 0811  Gross per 24 hour  Intake    720 ml  Output      0 ml  Net    720 ml    General: Alert, awake, oriented x3, in no acute distress. HEENT: No bruits, no goiter. Heart: Regular rate and rhythm, without murmurs, rubs,  gallops. Lungs: Clear to auscultation bilaterally. Abdomen: Soft, nontender, nondistended, positive bowel sounds. Extremities: No clubbing cyanosis or edema with positive pedal pulses. Neuro: Grossly intact, nonfocal.  Lab Results:  Lab 01/30/12 0455 01/29/12 0550 01/28/12 0619 01/27/12 0520 01/26/12 2330  WBC 12.9* 20.0* 20.2* 15.2* 15.6*  HGB 11.3* 11.4* 11.3* 12.1 12.3  HCT 35.9* 35.1* 34.2* 36.6 36.8  PLT 280 290 309 293 274    Lab 01/30/12 0455 01/29/12 0550 01/28/12 0619 01/27/12 0520 01/26/12 2330  NA 142 140 141 141 139  K 4.3 4.8 4.3 3.4* 3.2*  CL 102 103 103 101 100  CO2 30 27 27 23 23   GLUCOSE 100* 112* 165* 189* 167*  BUN 19 19 19 8 10   CREATININE 0.69 0.61 0.72 0.40* 0.46*  CALCIUM 8.9 9.3 9.4 9.7 9.2   Hospital Course:   Principal Problem:  *Asthma exacerbation  - clinical improvement an no wheezing on exam  - CT angio negative for PE  - continued to provide supportive care with nebulizer as needed and scheduled, antibiotics  - pt has responded well and is back to her baseline  Active Problems:  Tachycardia  - secondary to albuterol nebulizer  - labetalol started and albuterol scheduled discontinued  - continued to monitor on telemetry for now   HYPERLIPIDEMIA  - stable  - continue statin   Hypokalemia  - secondary to beta agonist use  - supplemented   Hyperglycemia  - secondary to steroid use  - continued SSI  - A1C within normal limits   Leukocytosis  - secondary to steroid use  - d/c solumedrol and started prednisone   EDUCATION  - test results and diagnostic studies were discussed with patient  - patient verbalized the understanding  - questions were answered at the bedside and contact information was provided for additional questions or concerns   Time spent on Discharge: Over 30 minutes  Signed: Debbora Presto 01/30/2012, 6:45 PM  [facility], pager #: [phone] Main office number: [phone]

## 2012-02-21 ENCOUNTER — Ambulatory Visit: Payer: 59 | Admitting: *Deleted

## 2012-02-22 ENCOUNTER — Ambulatory Visit: Payer: 59 | Admitting: Family Medicine

## 2012-02-23 ENCOUNTER — Ambulatory Visit (INDEPENDENT_AMBULATORY_CARE_PROVIDER_SITE_OTHER): Payer: 59 | Admitting: Family Medicine

## 2012-02-23 ENCOUNTER — Other Ambulatory Visit: Payer: Self-pay | Admitting: Family Medicine

## 2012-02-23 ENCOUNTER — Encounter: Payer: Self-pay | Admitting: Family Medicine

## 2012-02-23 DIAGNOSIS — J309 Allergic rhinitis, unspecified: Secondary | ICD-10-CM

## 2012-02-23 DIAGNOSIS — B351 Tinea unguium: Secondary | ICD-10-CM

## 2012-02-23 DIAGNOSIS — J302 Other seasonal allergic rhinitis: Secondary | ICD-10-CM

## 2012-02-23 MED ORDER — CICLOPIROX 8 % EX SOLN: Freq: Every day | CUTANEOUS | Status: AC

## 2012-02-23 NOTE — Progress Notes (Signed)
  Subjective:     Diane Shaw is an 61 y.o. female who presents for follow up of asthma. The patient is not currently have symptoms / an exacerbation. The patient has been having episodes for approximately 4 weeks. Symptoms in previous episodes have included chest pain, dyspnea, non-productive cough and wheezing, and typically last several days -. Previous episodes have been triggered by pollens and upper respiratory infection. Treatments tried during prior episodes include long-acting inhaled beta-adrenergic agonists and short-acting inhaled beta-adrenergic agonists, which usually provides complete resolution of symptoms.   Current Disease Severity [name] has frequent daytime asthma symptoms. She has frequent nighttime asthma symptoms. The patient is using short-acting beta agonists for symptom control several times per day. She has exacerbations requiring oral systemic corticosteroids 1 times per year. Current limitations in activity from asthma: none. Number of days of school or work missed in the last month: 3. Number of urgent/emergent visit in last year: 1   The following portions of the patient's history were reviewed and updated as appropriate: allergies, current medications, past family history, past medical history, past social history, past surgical history and problem list.  Review of Systems Pertinent items are noted in HPI.    Objective:     Filed Vitals:   02/23/12 1125  BP: 144/86  Pulse: 89  Temp: 98.3 F (36.8 C)  TempSrc: Oral  Weight: 226 lb 9.6 oz (102.785 kg)  SpO2: 98%   BP 144/86  Pulse 89  Temp(Src) 98.3 F (36.8 C) (Oral)  Wt 226 lb 9.6 oz (102.785 kg)  SpO2 98% General appearance: alert, cooperative, appears stated age and no distress Ears: normal TM's and external ear canals both ears Nose: Nares normal. Septum midline. Mucosa normal. No drainage or sinus tenderness. Throat: lips, mucosa, and tongue normal; teeth and gums normal Neck: no  adenopathy, supple, symmetrical, trachea midline and thyroid not enlarged, symmetric, no tenderness/mass/nodules Lungs: clear to auscultation bilaterally Heart: regular rate and rhythm, S1, S2 normal, no murmur, click, rub or gallop Extremities: extremities normal, atraumatic, no cyanosis or edema    Assessment:    Moderate persistent asthma, improved.     Plan:    Medications: no change. Discussed distinction between quick-relief and controlled medications. Discussed medication dosage, use, side effects, and goals of treatment in detail.   take antihistamine, mucinex, finish abx and keep appointment with allergist

## 2012-02-23 NOTE — Patient Instructions (Signed)
Asthma Attack Prevention HOW CAN ASTHMA BE PREVENTED? Currently, there is no way to prevent asthma from starting. However, you can take steps to control the disease and prevent its symptoms after you have been diagnosed. Learn about your asthma and how to control it. Take an active role to control your asthma by working with your caregiver to create and follow an asthma action plan. An asthma action plan guides you in taking your medicines properly, avoiding factors that make your asthma worse, tracking your level of asthma control, responding to worsening asthma, and seeking emergency care when needed. To track your asthma, keep records of your symptoms, check your peak flow number using a peak flow meter (handheld device that shows how well air moves out of your lungs), and get regular asthma checkups.  Other ways to prevent asthma attacks include:  Use medicines as your caregiver directs.   Identify and avoid things that make your asthma worse (as much as you can).   Keep track of your asthma symptoms and level of control.   Get regular checkups for your asthma.   With your caregiver, write a detailed plan for taking medicines and managing an asthma attack. Then be sure to follow your action plan. Asthma is an ongoing condition that needs regular monitoring and treatment.   Identify and avoid asthma triggers. A number of outdoor allergens and irritants (pollen, mold, cold air, air pollution) can trigger asthma attacks. Find out what causes or makes your asthma worse, and take steps to avoid those triggers (see below).   Monitor your breathing. Learn to recognize warning signs of an attack, such as slight coughing, wheezing or shortness of breath. However, your lung function may already decrease before you notice any signs or symptoms, so regularly measure and record your peak airflow with a home peak flow meter.   Identify and treat attacks early. If you act quickly, you're less likely to have  a severe attack. You will also need less medicine to control your symptoms. When your peak flow measurements decrease and alert you to an upcoming attack, take your medicine as instructed, and immediately stop any activity that may have triggered the attack. If your symptoms do not improve, get medical help.   Pay attention to increasing quick-relief inhaler use. If you find yourself relying on your quick-relief inhaler (such as albuterol), your asthma is not under control. See your caregiver about adjusting your treatment.  IDENTIFY AND CONTROL FACTORS THAT MAKE YOUR ASTHMA WORSE A number of common things can set off or make your asthma symptoms worse (asthma triggers). Keep track of your asthma symptoms for several weeks, detailing all the environmental and emotional factors that are linked with your asthma. When you have an asthma attack, go back to your asthma diary to see which factor, or combination of factors, might have contributed to it. Once you know what these factors are, you can take steps to control many of them.  Allergies: If you have allergies and asthma, it is important to take asthma prevention steps at home. Asthma attacks (worsening of asthma symptoms) can be triggered by allergies, which can cause temporary increased inflammation of your airways. Minimizing contact with the substance to which you are allergic will help prevent an asthma attack. Animal Dander:   Some people are allergic to the flakes of skin or dried saliva from animals with fur or feathers. Keep these pets out of your home.   If you can't keep a pet outdoors, keep the   pet out of your bedroom and other sleeping areas at all times, and keep the door closed.   Remove carpets and furniture covered with cloth from your home. If that is not possible, keep the pet away from fabric-covered furniture and carpets.  Dust Mites:  Many people with asthma are allergic to dust mites. Dust mites are tiny bugs that are found in  every home, in mattresses, pillows, carpets, fabric-covered furniture, bedcovers, clothes, stuffed toys, fabric, and other fabric-covered items.   Cover your mattress in a special dust-proof cover.   Cover your pillow in a special dust-proof cover, or wash the pillow each week in hot water. Water must be hotter than 130 F to kill dust mites. Cold or warm water used with detergent and bleach can also be effective.   Wash the sheets and blankets on your bed each week in hot water.   Try not to sleep or lie on cloth-covered cushions.   Call ahead when traveling and ask for a smoke-free hotel room. Bring your own bedding and pillows, in case the hotel only supplies feather pillows and down comforters, which may contain dust mites and cause asthma symptoms.   Remove carpets from your bedroom and those laid on concrete, if you can.   Keep stuffed toys out of the bed, or wash the toys weekly in hot water or cooler water with detergent and bleach.  Cockroaches:  Many people with asthma are allergic to the droppings and remains of cockroaches.   Keep food and garbage in closed containers. Never leave food out.   Use poison baits, traps, powders, gels, or paste (for example, boric acid).   If a spray is used to kill cockroaches, stay out of the room until the odor goes away.  Indoor Mold:  Fix leaky faucets, pipes, or other sources of water that have mold around them.   Clean moldy surfaces with a cleaner that has bleach in it.  Pollen and Outdoor Mold:  When pollen or mold spore counts are high, try to keep your windows closed.   Stay indoors with windows closed from late morning to afternoon, if you can. Pollen and some mold spore counts are highest at that time.   Ask your caregiver whether you need to take or increase anti-inflammatory medicine before your allergy season starts.  Irritants:   Tobacco smoke is an irritant. If you smoke, ask your caregiver how you can quit. Ask family  members to quit smoking, too. Do not allow smoking in your home or car.   If possible, do not use a wood-burning stove, kerosene heater, or fireplace. Minimize exposure to all sources of smoke, including incense, candles, fires, and fireworks.   Try to stay away from strong odors and sprays, such as perfume, talcum powder, hair spray, and paints.   Decrease humidity in your home and use an indoor air cleaning device. Reduce indoor humidity to below 60 percent. Dehumidifiers or central air conditioners can do this.   Try to have someone else vacuum for you once or twice a week, if you can. Stay out of rooms while they are being vacuumed and for a short while afterward.   If you vacuum, use a dust mask from a hardware store, a double-layered or microfilter vacuum cleaner bag, or a vacuum cleaner with a HEPA filter.   Sulfites in foods and beverages can be irritants. Do not drink beer or wine, or eat dried fruit, processed potatoes, or shrimp if they cause asthma   symptoms.   Cold air can trigger an asthma attack. Cover your nose and mouth with a scarf on cold or windy days.   Several health conditions can make asthma more difficult to manage, including runny nose, sinus infections, reflux disease, psychological stress, and sleep apnea. Your caregiver will treat these conditions, as well.   Avoid close contact with people who have a cold or the flu, since your asthma symptoms may get worse if you catch the infection from them. Wash your hands thoroughly after touching items that may have been handled by people with a respiratory infection.   Get a flu shot every year to protect against the flu virus, which often makes asthma worse for days or weeks. Also get a pneumonia shot once every five to 10 years.  Drugs:  Aspirin and other painkillers can cause asthma attacks. 10% to 20% of people with asthma have sensitivity to aspirin or a group of painkillers called non-steroidal anti-inflammatory drugs  (NSAIDS), such as ibuprofen and naproxen. These drugs are used to treat pain and reduce fevers. Asthma attacks caused by any of these medicines can be severe and even fatal. These drugs must be avoided in people who have known aspirin sensitive asthma. Products with acetaminophen are considered safe for people who have asthma. It is important that people with aspirin sensitivity read labels of all over-the-counter drugs used to treat pain, colds, coughs, and fever.   Beta blockers and ACE inhibitors are other drugs which you should discuss with your caregiver, in relation to your asthma.  ALLERGY SKIN TESTING  Ask your asthma caregiver about allergy skin testing or blood testing (RAST test) to identify the allergens to which you are sensitive. If you are found to have allergies, allergy shots (immunotherapy) for asthma may help prevent future allergies and asthma. With allergy shots, small doses of allergens (substances to which you are allergic) are injected under your skin on a regular schedule. Over a period of time, your body may become used to the allergen and less responsive with asthma symptoms. You can also take measures to minimize your exposure to those allergens. EXERCISE  If you have exercise-induced asthma, or are planning vigorous exercise, or exercise in cold, humid, or dry environments, prevent exercise-induced asthma by following your caregiver's advice regarding asthma treatment before exercising. Document Released: 08/31/2009 Document Revised: 09/01/2011 Document Reviewed: 08/31/2009 Exit[employer] Patient Information 2012 [employer]. 

## 2012-02-28 ENCOUNTER — Encounter: Payer: Self-pay | Admitting: *Deleted

## 2012-02-28 ENCOUNTER — Encounter: Payer: 59 | Attending: Family Medicine | Admitting: *Deleted

## 2012-02-28 VITALS — Ht 65.0 in | Wt 225.0 lb

## 2012-02-28 DIAGNOSIS — E785 Hyperlipidemia, unspecified: Secondary | ICD-10-CM | POA: Insufficient documentation

## 2012-02-28 DIAGNOSIS — Z713 Dietary counseling and surveillance: Secondary | ICD-10-CM | POA: Insufficient documentation

## 2012-02-28 DIAGNOSIS — E669 Obesity, unspecified: Secondary | ICD-10-CM | POA: Insufficient documentation

## 2012-02-28 NOTE — Patient Instructions (Signed)
Goals:  Eat 3 meals/day, Avoid meal skipping   Increase protein rich foods  Limit fast food to 1 day a week. Get fruit or side salad instead of fries  Follow "Plate Method" for portion control  Choose more whole grains, lean protein, low-fat dairy, and fruits/non-starchy vegetables.   Aim for >10 min of physical activity daily  Limit sugar-sweetened beverages and concentrated sweets  Continue to limit soda; goal is only on occasion.  More water. Use artificial sweetener for flavor  Try Skinny Cow brand frozen treats, on occasion or frozen yogurt or sorbet; or flavored cheerios

## 2012-02-28 NOTE — Progress Notes (Signed)
  Medical Nutrition Therapy:  Appt start time: 1230 end time:  1300.   Assessment:  Primary concerns today: obesity and hyperlipidemia follow-up.   Wt Readings from Last 3 Encounters:  02/28/12 225 lb (102.059 kg)  02/23/12 226 lb 9.6 oz (102.785 kg)  01/27/12 220 lb (99.791 kg)    MEDICATIONS: see list   DIETARY INTAKE:  Usual eating pattern includes 3 meals and 2 snacks per day.  Everyday foods include fast food, full fat milk  24-hr recall:  B ( AM): smoothie or cereal with whole milk; or sausage pattie with wheat bread 2; drinks water  Snk ( AM): maybe peanut butter crackers, yogurt or cottage cheese with pineapple  L ( PM): subway sandwich 1 day a week or kids meal fast food with fries. soda Snk ( PM): ice cream, frozen fruit bars D ( PM): pizza; ham sandwich; fried chicken Snk ( PM): sometimes fruit and nut bar, ice cream sometimes Beverages: water, soda  Usual physical activity: none  Estimated energy needs: 1400 calories 158 g carbohydrates 105 g protein 39 g fat  Progress Towards Goal(s):  Some progress.   Nutritional Diagnosis:  NI-5.6.2 Excessive fat intake related to eating out and full fat milk.  As evidenced by obesity and hyperlipidemia.    Intervention:  Nutrition counseling provided.  Client admits that she hasn't made much progress, but is making some changes.  She had a bad asthma attack and has been reluctant to exercise since.  Has limited fast food consumption, but still eats out frequently.  Still drinks full fat milk and regular sodas.  Apploauded her for the progress she has made.  Encouraged limiting fast food/choosing healthier options when eating out/eating smaller portions. Encouraged increasing physical activity and fiber-rich foods like whole grains, fruits and vegetables  Handouts given during visit include:  Eating healthy while eating out   Weight and women: loosing the first 10 pounds  Goals:  Eat 3 meals/day, Avoid meal skipping     Increase protein rich foods  Limit fast food to 1 day a week. Get fruit or side salad instead of fries  Follow "Plate Method" for portion control  Choose more whole grains, lean protein, low-fat dairy, and fruits/non-starchy vegetables.   Aim for >10 min of physical activity daily  Limit sugar-sweetened beverages and concentrated sweets  Continue to limit soda; goal is only on occasion.  More water. Use artificial sweetener for flavor  Try Skinny Cow brand frozen treats, on occasion or frozen yogurt or sorbet; or flavored cheerios  Monitoring/Evaluation:  Dietary intake, exercise, and body weight in 4 week(s).

## 2012-03-28 ENCOUNTER — Ambulatory Visit: Payer: 59 | Admitting: *Deleted

## 2012-04-10 ENCOUNTER — Telehealth: Payer: Self-pay | Admitting: Family Medicine

## 2012-04-10 NOTE — Telephone Encounter (Signed)
Patient was seen 5.12 so she would need an OV. Please scheduled, if she has any concerns please forward the call to me.     KP

## 2012-04-10 NOTE — Telephone Encounter (Signed)
Seen 01/27/11. Please advise      KP

## 2012-04-10 NOTE — Telephone Encounter (Signed)
Wants to be referred for left arm pain, stated she saw dr. Laury Axon in feb/march for this. I do not see this in the chart. Last OV 05.30.13 Cb# [phone] til 5pm home# [phone]

## 2012-04-10 NOTE — Telephone Encounter (Signed)
She wasn't seen in feb/march?-----we need to have office notes to send to specialist

## 2012-04-11 ENCOUNTER — Ambulatory Visit (INDEPENDENT_AMBULATORY_CARE_PROVIDER_SITE_OTHER): Payer: 59 | Admitting: Family Medicine

## 2012-04-11 ENCOUNTER — Encounter: Payer: Self-pay | Admitting: Family Medicine

## 2012-04-11 ENCOUNTER — Other Ambulatory Visit: Payer: Self-pay | Admitting: Family Medicine

## 2012-04-11 ENCOUNTER — Ambulatory Visit (INDEPENDENT_AMBULATORY_CARE_PROVIDER_SITE_OTHER)
Admission: RE | Admit: 2012-04-11 | Discharge: 2012-04-11 | Disposition: A | Discharge status: Home / Self Care | Payer: 59 | Source: Ambulatory Visit | Attending: Family Medicine | Admitting: Family Medicine

## 2012-04-11 VITALS — BP 138/80 | HR 89 | Temp 98.3°F | Wt 226.0 lb

## 2012-04-11 DIAGNOSIS — M5412 Radiculopathy, cervical region: Secondary | ICD-10-CM

## 2012-04-11 DIAGNOSIS — M542 Cervicalgia: Secondary | ICD-10-CM

## 2012-04-11 DIAGNOSIS — M79603 Pain in arm, unspecified: Secondary | ICD-10-CM

## 2012-04-11 MED ORDER — TRAMADOL HCL 50 MG PO TABS: 50.0000 mg | ORAL_TABLET | Freq: Three times a day (TID) | ORAL | Status: AC | PRN

## 2012-04-11 MED ORDER — CYCLOBENZAPRINE HCL 10 MG PO TABS: 10.0000 mg | ORAL_TABLET | Freq: Three times a day (TID) | ORAL | Status: AC | PRN

## 2012-04-11 NOTE — Telephone Encounter (Signed)
Patient is coming in today 1130am

## 2012-04-11 NOTE — Progress Notes (Signed)
  Subjective:     Diane Shaw is a 61 y.o. female who presents for evaluation of neck pain. Event that precipitated these symptoms: fall from a ladder about 17 years ago.. Onset of symptoms was 3 weeks ago, and have been gradually worsening since that time. Current symptoms are numbness in L arm and hand  and pain in neck , upper arm (burning, numbing and tingling in character; 7/10 in severity). Patient denies stiffness in L arm and weakness in l arm. Patient has had neck pain for 17 years.. Previous treatments: none.  The following portions of the patient's history were reviewed and updated as appropriate: allergies, current medications, past family history, past medical history, past social history, past surgical history and problem list.  Review of Systems Pertinent items are noted in HPI.    Objective:    BP 138/80  Pulse 89  Temp 98.3 F (36.8 C) (Oral)  Wt 226 lb (102.513 kg)  SpO2 98% General:   alert, cooperative, appears stated age and no distress  External Deformity:  absent  ROM Cervical Spine:  normal range of motion, supple and stiffness with turning to left  Midline Tenderness:  absent midline  Paraspinous tenderness:  moderate on the left  UE Neurologic Exam:  normal strength, normal sensation, normal reflexes   X-ray of the cervical spine: not available    Assessment:    Cervical pain Cervical strain    Plan:    Discussed the cervical pain, its course and treatment. Agricultural engineer distributed. Discussed appropriate use of ice and heat. Muscle relaxants started per medication orders. Plain film x-rays. Follow up in  2 weeks. - if needed

## 2012-04-11 NOTE — Patient Instructions (Signed)
Cervical Sprain  A cervical sprain is when the ligaments in the neck stretch or tear. The ligaments are the tissues that hold the neck bones in place.  HOME CARE    Put ice on the injured area.   Put ice in a plastic bag.   Place a towel between your skin and the bag.   Leave the ice on for 15 to 20 minutes, 3 to 4 times a day.   Only take medicine as told by your doctor.   Keep all doctor visits as told.   Keep all physical therapy visits as told.   If your doctor gives you a neck collar, wear it as told.   Do not drive while wearing a neck collar.   Adjust your work station so that you have good posture while you work.   Avoid positions and activities that make your problems worse.   Warm up and stretch before being active.  GET HELP RIGHT AWAY IF:    You are bleeding or your stomach is upset.   You have an allergic reaction to your medicine.   Your problems (symptoms) get worse.   You develop new problems.   You lose feeling (numbness) or you cannot move (paralysis) any part of your body.   You have tingling or weakness in any part of your body.   Your pain is not controlled with medicine.   You cannot take less pain medicine over time as planned.   Your activity level does not improve as expected.  MAKE SURE YOU:    Understand these instructions.   Will watch your condition.   Will get help right away if you are not doing well or get worse.  Document Released: 02/29/2008 Document Revised: 09/01/2011 Document Reviewed: 06/16/2011  [employer] Patient Information 2012 [employer].

## 2012-04-20 ENCOUNTER — Telehealth: Payer: Self-pay | Admitting: *Deleted

## 2012-04-20 ENCOUNTER — Encounter: Payer: Self-pay | Admitting: Family Medicine

## 2012-04-20 DIAGNOSIS — M542 Cervicalgia: Secondary | ICD-10-CM

## 2012-04-20 NOTE — Telephone Encounter (Signed)
Pt left msg on traige vmail requesitng x-ray results.

## 2012-04-20 NOTE — Telephone Encounter (Signed)
Left message to call office   Notes Recorded by Lelon Perla, DO on 04/11/2012 at 6:08 PM Neg xray----refer to pt

## 2012-04-23 NOTE — Telephone Encounter (Signed)
Copy of results mailed to the patient and PT referral put in, I have attempted to contact the patient more than 3 times.    KP

## 2012-04-24 ENCOUNTER — Encounter: Payer: Self-pay | Admitting: Family Medicine

## 2012-05-02 ENCOUNTER — Encounter: Payer: Self-pay | Admitting: Family Medicine

## 2012-05-08 ENCOUNTER — Ambulatory Visit: Payer: 59 | Attending: Family Medicine | Admitting: Physical Therapy

## 2012-05-08 DIAGNOSIS — M542 Cervicalgia: Secondary | ICD-10-CM | POA: Insufficient documentation

## 2012-05-08 DIAGNOSIS — M2569 Stiffness of other specified joint, not elsewhere classified: Secondary | ICD-10-CM | POA: Insufficient documentation

## 2012-05-08 DIAGNOSIS — IMO0001 Reserved for inherently not codable concepts without codable children: Secondary | ICD-10-CM | POA: Insufficient documentation

## 2012-05-08 DIAGNOSIS — M25519 Pain in unspecified shoulder: Secondary | ICD-10-CM | POA: Insufficient documentation

## 2012-05-16 ENCOUNTER — Ambulatory Visit: Payer: 59 | Admitting: Physical Therapy

## 2012-06-19 ENCOUNTER — Other Ambulatory Visit: Payer: Self-pay | Admitting: Family Medicine

## 2012-07-24 ENCOUNTER — Ambulatory Visit (INDEPENDENT_AMBULATORY_CARE_PROVIDER_SITE_OTHER): Payer: 59 | Admitting: Family Medicine

## 2012-07-24 ENCOUNTER — Encounter: Payer: Self-pay | Admitting: Family Medicine

## 2012-07-24 VITALS — BP 136/82 | HR 116 | Temp 98.5°F | Wt 223.2 lb

## 2012-07-24 DIAGNOSIS — Z23 Encounter for immunization: Secondary | ICD-10-CM

## 2012-07-24 DIAGNOSIS — J45909 Unspecified asthma, uncomplicated: Secondary | ICD-10-CM

## 2012-07-24 DIAGNOSIS — E785 Hyperlipidemia, unspecified: Secondary | ICD-10-CM

## 2012-07-24 DIAGNOSIS — J45901 Unspecified asthma with (acute) exacerbation: Secondary | ICD-10-CM

## 2012-07-24 MED ORDER — ALBUTEROL SULFATE (5 MG/ML) 0.5% IN NEBU
2.5000 mg | INHALATION_SOLUTION | Freq: Once | RESPIRATORY_TRACT | Status: AC
  Administered 2012-07-24: 2.5 mg via RESPIRATORY_TRACT

## 2012-07-24 MED ORDER — MOMETASONE FURO-FORMOTEROL FUM 200-5 MCG/ACT IN AERO: INHALATION_SPRAY | RESPIRATORY_TRACT | Status: DC

## 2012-07-24 NOTE — Patient Instructions (Signed)

## 2012-07-24 NOTE — Progress Notes (Signed)
  Subjective:     Diane Shaw is an 61 y.o. female who presents for follow up of asthma. The patient is currently having symptoms / an exacerbation. Current symptoms include chest tightness, dyspnea, non-productive cough and wheezing. Symptoms have been present since several days ago and have been gradually improving.  pt was in the hospital in [place].  See hospital d/c.  She denies chest pain and productive cough. Associated symptoms include shortness of breath and wheezing.  This episode appears to have been triggered by exercise, infection, pollens and upper respiratory infection. Treatments tried for the current exacerbation include inhaled corticosteroids, long-acting inhaled beta-adrenergic agonists, short-acting inhaled beta-adrenergic agonists and systemic corticosteroids, which have provided some relief of symptoms.   Current Disease Severity Diane Shaw has daytime symptoms 2 day a month or less.   She has infrequent night time symptoms. The patient is using short-acting beta agonists for symptom control 2 days a week or less.  She has exacerbations requiring oral systemic corticosteroids 2 times per year. Current limitations in activity from asthma: gets sob with limited activity.. Number of urgent/emergent visit in last year: 3   The following portions of the patient's history were reviewed and updated as appropriate: allergies, current medications, past family history, past medical history, past social history, past surgical history and problem list.  Review of Systems Pertinent items are noted in HPI.    Objective:    Oxygen saturation 98% on room air BP 136/82  Pulse 116  Temp 98.5 F (36.9 C) (Oral)  Wt 223 lb 3.2 oz (101.243 kg)  SpO2 98% General appearance: alert, cooperative, appears stated age and mild distress Neck: no adenopathy, supple, symmetrical, trachea midline and thyroid not enlarged, symmetric, no tenderness/mass/nodules Lungs: wheezes bilaterally Heart: S1,  S2 normal Extremities: extremities normal, atraumatic, no cyanosis or edema    Assessment:    Intermittent asthma, improved.     Plan:    Medications: discontinue advair  and begin dulera. Beta-agonist nebulizer treatment given in the office with complete relief of symptoms. Discussed distinction between quick-relief and controlled medications. Asthma information handout given. refer to pulm

## 2012-07-25 LAB — BASIC METABOLIC PANEL
BUN: 10 mg/dL (ref 6–23)
CO2: 29 mEq/L (ref 19–32)
Calcium: 9.2 mg/dL (ref 8.4–10.5)
Chloride: 106 mEq/L (ref 96–112)
Glucose, Bld: 128 mg/dL — ABNORMAL HIGH (ref 70–99)
Sodium: 141 mEq/L (ref 135–145)

## 2012-07-25 LAB — LIPID PANEL
Cholesterol: 268 mg/dL — ABNORMAL HIGH (ref 0–200)
Total CHOL/HDL Ratio: 4
Triglycerides: 126 mg/dL (ref 0.0–149.0)
VLDL: 25.2 mg/dL (ref 0.0–40.0)

## 2012-07-25 LAB — LDL CHOLESTEROL, DIRECT: Direct LDL: 194.5 mg/dL

## 2012-08-02 ENCOUNTER — Other Ambulatory Visit: Payer: Self-pay

## 2012-08-02 MED ORDER — ATORVASTATIN CALCIUM 40 MG PO TABS: 40.0000 mg | ORAL_TABLET | Freq: Every day | ORAL | Status: DC

## 2012-08-03 ENCOUNTER — Encounter: Payer: Self-pay | Admitting: Internal Medicine

## 2012-08-03 ENCOUNTER — Ambulatory Visit (INDEPENDENT_AMBULATORY_CARE_PROVIDER_SITE_OTHER): Payer: 59 | Admitting: Internal Medicine

## 2012-08-03 VITALS — BP 122/82 | HR 75 | Temp 97.8°F | Ht 66.0 in | Wt 227.8 lb

## 2012-08-03 DIAGNOSIS — J45909 Unspecified asthma, uncomplicated: Secondary | ICD-10-CM

## 2012-08-03 NOTE — Patient Instructions (Addendum)
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

## 2012-08-03 NOTE — Progress Notes (Signed)
Subjective:    Patient ID: Diane Shaw, female    DOB: [birth year], 61 y.o.   MRN: [id number]  HPI  PMD is Loreen Freud, DO  Body mass index is 36.77 kg/(m^2).  reports that she has never smoked. She has never used smokeless tobacco.    IOV 08/03/2012  AA female. Hx of asthma  X 20 years. Diagnosd in [place]. This year 2 admission (see pulm hx) and Dr Laury Axon wanted to ensure nothing untwoards. Was maintained on advair for > 1 year and also on zyflo. Few weeks ago changed to dulera. Currently biggst issue is at night when trying to sleep. Has fluctating pattern of symptoms. 2 months ago waking up each night and using albuterol rescue. Past 2 months asthma is improved and used rescue 5-6 times. Has had pft's - x 3 years ago at [facility] but does not remember results. Denies ICU admits for asthma but has been to ER atleast 2 times in 2013 (see below ) and once in 2012 and hsa been averaging once a year past several years. Asthma triggered by smells, dust. Asthma improved by cool air. However, denies allergies other than worsened dyspnea with shell fish. Symptoms of asthma: dyspnea, cough, wheeze. Current cough symptoms below with RSI cough score of 15.    She maintains good compliance with mdi Rx  BP   She has hypetension and is on labetalol (mixed alpha and beta blocker)x  3 m  GERD  - on ppi due to nocturnal awakenings but denies heart burn  Sinus  - occ sinus drainage. Not on rx  Allergies  - denies allergies  - skin test negative per hx 10 years ago at [facility]   Pulmonary hx  - May 2013 - Admitted dyspnea.  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 DC dx was AE-asthma  - Hospitalized in Oct 2013 - following choking on food and resultant pneumonia. Admit [place]     Dr Gretta Cool Reflux Symptom Index (> 13-15 suggestive of LPR cough) 0 -> 5  =  none ->severe problem  Hoarseness of problem with voice 0  Clearing  Of Throat 2    Excess throat mucus or feeling of post nasal drip 4  Difficulty swallowing food, liquid or tablets 0  Cough after eating or lying down 3  Breathing difficulties or choking episodes 1  Troublesome or annoying cough 3  Sensation of something sticking in throat or lump in throat 1  Heartburn, chest pain, indigestion, or stomach acid coming up 1  TOTAL 15      Kouffman Reflux v Neurogenic Cough Differentiator Reflux Comments  Do you awaken from a sound sleep coughing violently?                            With trouble breathing? Yes   Do you have choking episodes when you cannot  Get enough air, gasping for air ?              no   Do you usually cough when you lie down into  The bed, or when you just lie down to rest ?                          Yes   Do you usually cough after meals or eating?         no   Do  you cough when (or after) you bend over?    Yes   GERD SCORE  3   Kouffman Reflux v Neurogenic Cough Differentiator Neurogenic   Do you more-or-less cough all day long? n   Does change of temperature make you cough? y   Does laughing or chuckling cause you to cough? n   Do fumes (perfume, automobile fumes, burned  Toast, etc.,) cause you to cough ?      y   Does speaking, singing, or talking on the phone cause you to cough   ?               n   Neurogenic/Airway score 2    Past Medical History  Diagnosis Date  . Asthma   . HTN (hypertension)      Family History  Problem Relation Age of Onset  . Heart failure Sister   . Diabetes Sister   . Heart disease Sister     chf  . Diabetes Mother   . Hypertension Mother   . Heart disease Mother     chf  . Heart disease Father 75    MI  . Hypertension Father   . Heart disease Brother   . Arthritis Sister   . Heart disease Brother     Cabg     History   Social History  . Marital Status: Single    Spouse Name: N/A    Number of Children: N/A  . Years of Education: N/A   Occupational History  . Not on file.    Social History Main Topics  . Smoking status: Never Smoker   . Smokeless tobacco: Never Used  . Alcohol Use: 0.6 - 1.2 oz/week    1-2 Glasses of wine per week  . Drug Use: No  . Sexually Active: Yes -- Female partner(s)    Birth Control/ Protection: None   Other Topics Concern  . Not on file   Social History Narrative  . No narrative on file     No Known Allergies   Outpatient Prescriptions Prior to Visit  Medication Sig Dispense Refill  . albuterol (PROVENTIL HFA) 108 (90 BASE) MCG/ACT inhaler Inhale 2 puffs into the lungs every 6 (six) hours as needed. For shortness of breath  1 Inhaler  3  . albuterol (PROVENTIL) (2.5 MG/3ML) 0.083% nebulizer solution inhale contents of 1 vial in nebulizer every 6 hours if needed for shortness of breath  3 mL  3  . atorvastatin (LIPITOR) 40 MG tablet Take 1 tablet (40 mg total) by mouth at bedtime.  30 tablet  2  . chlorpheniramine-HYDROcodone (TUSSIONEX) 10-8 MG/5ML LQCR Take 5 mLs by mouth every 12 (twelve) hours as needed.  140 mL  1  . labetalol (NORMODYNE) 100 MG tablet Take 1 tablet (100 mg total) by mouth daily.  30 tablet  1  . mometasone-formoterol (DULERA) 200-5 MCG/ACT AERO 2 puffs bid  1 Inhaler  0  . Multiple Vitamins-Minerals (MULTIVITAMINS THER. W/MINERALS) TABS Take 1 tablet by mouth daily.        . vitamin B-12 (CYANOCOBALAMIN) 250 MCG tablet Take 250 mcg by mouth daily.      . zileuton (ZYFLO CR) 600 MG CR tablet Take 1,200 mg by mouth daily.        Last reviewed on 08/03/2012  4:22 PM by Darrell Jewel, CMA    Review of Systems  Constitutional: Negative for fever and unexpected weight change.  HENT: Negative for ear pain, nosebleeds, congestion,  sore throat, rhinorrhea, sneezing, trouble swallowing, dental problem, postnasal drip and sinus pressure.   Eyes: Negative for redness and itching.  Respiratory: Positive for cough and shortness of breath. Negative for chest tightness and wheezing.   Cardiovascular: Negative  for palpitations and leg swelling.  Gastrointestinal: Negative for nausea and vomiting.  Genitourinary: Negative for dysuria.  Musculoskeletal: Negative for joint swelling.  Skin: Negative for rash.  Neurological: Negative for headaches.  Hematological: Does not bruise/bleed easily.  Psychiatric/Behavioral: Negative for dysphoric mood. The patient is not nervous/anxious.        Objective:   Physical Exam  Vitals reviewed. Constitutional: She is oriented to person, place, and time. She appears well-developed and well-nourished. No distress.       Body mass index is 36.77 kg/(m^2).   HENT:  Head: Normocephalic and atraumatic.  Right Ear: External ear normal.  Left Ear: External ear normal.  Mouth/Throat: Oropharynx is clear and moist. No oropharyngeal exudate.  Eyes: Conjunctivae normal and EOM are normal. Pupils are equal, round, and reactive to light. Right eye exhibits no discharge. Left eye exhibits no discharge. No scleral icterus.  Neck: Normal range of motion. Neck supple. No JVD present. No tracheal deviation present. No thyromegaly present.  Cardiovascular: Normal rate, regular rhythm, normal heart sounds and intact distal pulses.  Exam reveals no gallop and no friction rub.   No murmur heard. Pulmonary/Chest: Effort normal and breath sounds normal. No respiratory distress. She has no wheezes. She has no rales. She exhibits no tenderness.  Abdominal: Soft. Bowel sounds are normal. She exhibits no distension and no mass. There is no tenderness. There is no rebound and no guarding.  Musculoskeletal: Normal range of motion. She exhibits no edema and no tenderness.  Lymphadenopathy:    She has no cervical adenopathy.  Neurological: She is alert and oriented to person, place, and time. She has normal reflexes. No cranial nerve deficit. She exhibits normal muscle tone. Coordination normal.  Skin: Skin is warm and dry. No rash noted. She is not diaphoretic. No erythema. No pallor.   Psychiatric: She has a normal mood and affect. Her behavior is normal. Judgment and thought content normal.          Assessment & Plan:

## 2012-08-05 ENCOUNTER — Telehealth: Payer: Self-pay | Admitting: Internal Medicine

## 2012-08-05 DIAGNOSIS — J45909 Unspecified asthma, uncomplicated: Secondary | ICD-10-CM | POA: Insufficient documentation

## 2012-08-05 NOTE — Telephone Encounter (Signed)
Hi Yvonne  I noticed she is on labetalol. It has some beta blocking action. Wondered if you could change her to an ARB or calcium channel blocker instead ?  Thanks  MR

## 2012-08-05 NOTE — Assessment & Plan Note (Signed)
Not sure if there are control issues with asthma or there are just 2 coincidental admission. Clearly in one of them she choked on food. I note she is on labetalol which has beta blocking properties.   PLAN I willget PFTs and compare them to [facility] I will write to Loreen Freud, DO her pcp to replace labetalol with an ARB or Calcium channel blocker Depending on PTs and response to dc labetalol might have to consider allergy or swallow eval

## 2012-08-06 ENCOUNTER — Ambulatory Visit (INDEPENDENT_AMBULATORY_CARE_PROVIDER_SITE_OTHER): Payer: 59 | Admitting: Internal Medicine

## 2012-08-06 DIAGNOSIS — J45909 Unspecified asthma, uncomplicated: Secondary | ICD-10-CM

## 2012-08-06 LAB — PULMONARY FUNCTION TEST

## 2012-08-06 NOTE — Progress Notes (Signed)
PFT done today. 

## 2012-08-06 NOTE — Telephone Encounter (Signed)
Ok thanks. Appreciat it

## 2012-08-06 NOTE — Telephone Encounter (Signed)
We need to change med but would like to see her before we do it.

## 2012-08-06 NOTE — Telephone Encounter (Signed)
Looks like it was started in hospital for tachycardia.  I'll get in touch with her and change it.  Myrene Buddy

## 2012-08-06 NOTE — Telephone Encounter (Signed)
Msg left for a return call.    KP 

## 2012-08-07 NOTE — Telephone Encounter (Signed)
msg left call at home number     KP

## 2012-08-08 ENCOUNTER — Telehealth: Payer: Self-pay | Admitting: Internal Medicine

## 2012-08-08 NOTE — Telephone Encounter (Signed)
pft shows asthma -severe. (for my use: fev1 1L/44%, ratio 50, 42% Bd response, RV 125%, dLCO 16.5/63%)  She needs to come off labetalol. I have told her pcp Loreen Freud, DO this and they are trying to get hold of patient to see them to place on alternative.   Please tell her to  a)  go see PMD asap to make this change B) see me back in 2-3 weeks after seeing PMD; give her appt please. We di spirometry at that time  Thanks MR

## 2012-08-10 NOTE — Telephone Encounter (Signed)
Unable to contact the patient.    Letter mailed     KP

## 2012-08-10 NOTE — Telephone Encounter (Signed)
[email or web address].Jennifer Castillo, CMA  

## 2012-08-13 NOTE — Telephone Encounter (Signed)
Pt returned call. Call her at work now @ [phone]. Diane Shaw

## 2012-08-14 NOTE — Telephone Encounter (Signed)
[email or web address] at home and work number.Carron Curie, CMA

## 2012-08-15 NOTE — Telephone Encounter (Signed)
Pt called back again- requests to speak to a nurse asap today please since jennifer is off. Hazel Sams

## 2012-08-15 NOTE — Telephone Encounter (Signed)
Spoke with patient-aware of recs from MR and is calling Lowne's office today for OV asap and call our office back to schedule an appt in 2-3 after seeing her PCP.

## 2012-08-21 ENCOUNTER — Ambulatory Visit (INDEPENDENT_AMBULATORY_CARE_PROVIDER_SITE_OTHER): Payer: 59 | Admitting: Family Medicine

## 2012-08-21 ENCOUNTER — Encounter: Payer: Self-pay | Admitting: Family Medicine

## 2012-08-21 VITALS — BP 134/80 | HR 82 | Temp 98.4°F | Wt 226.6 lb

## 2012-08-21 DIAGNOSIS — I10 Essential (primary) hypertension: Secondary | ICD-10-CM

## 2012-08-21 DIAGNOSIS — R000 Tachycardia, unspecified: Secondary | ICD-10-CM

## 2012-08-21 MED ORDER — DILTIAZEM HCL ER COATED BEADS 120 MG PO CP24: 120.0000 mg | ORAL_CAPSULE | Freq: Every day | ORAL | Status: DC

## 2012-08-21 NOTE — Patient Instructions (Signed)

## 2012-08-21 NOTE — Progress Notes (Signed)
  Subjective:    Patient here for follow-up of elevated blood pressure.  She is not exercising and is adherent to a low-salt diet.  Blood pressure is well controlled at home. Cardiac symptoms: none. Patient denies: chest pain, chest pressure/discomfort, claudication, dyspnea, exertional chest pressure/discomfort, fatigue, irregular heart beat, lower extremity edema, near-syncope, orthopnea, palpitations, paroxysmal nocturnal dyspnea, syncope and tachypnea. Cardiovascular risk factors: dyslipidemia, hypertension, obesity (BMI >= 30 kg/m2) and sedentary lifestyle. Use of agents associated with hypertension: none. History of target organ damage: none.  The following portions of the patient's history were reviewed and updated as appropriate: allergies, current medications, past family history, past medical history, past social history, past surgical history and problem list.  Review of Systems Pertinent items are noted in HPI.     Objective:    BP 134/80  Pulse 82  Temp 98.4 F (36.9 C) (Oral)  Wt 226 lb 9.6 oz (102.785 kg)  SpO2 94% General appearance: alert, cooperative, appears stated age and no distress Lungs: clear to auscultation bilaterally Heart: S1, S2 normal Extremities: extremities normal, atraumatic, no cyanosis or edema    Assessment:    Hypertension, normal blood pressure . Evidence of target organ damage: none.    Plan:    Medication: discontinue labetalol and begin cartia. Dietary sodium restriction. Regular aerobic exercise. Check blood pressures 2-3 times weekly and record. Follow up: 2 weeks and as needed.  Labetalol d/c secondary to asthma

## 2012-08-22 ENCOUNTER — Encounter: Payer: Self-pay | Admitting: Internal Medicine

## 2012-09-04 ENCOUNTER — Other Ambulatory Visit: Payer: Self-pay | Admitting: *Deleted

## 2012-09-04 DIAGNOSIS — J45909 Unspecified asthma, uncomplicated: Secondary | ICD-10-CM

## 2012-09-04 MED ORDER — MOMETASONE FURO-FORMOTEROL FUM 200-5 MCG/ACT IN AERO: INHALATION_SPRAY | RESPIRATORY_TRACT | Status: DC

## 2012-09-04 NOTE — Telephone Encounter (Signed)
Rx for [facility] sent to pharmacy, pt notified.

## 2012-11-06 ENCOUNTER — Telehealth: Payer: Self-pay | Admitting: Family Medicine

## 2012-11-06 ENCOUNTER — Ambulatory Visit (INDEPENDENT_AMBULATORY_CARE_PROVIDER_SITE_OTHER): Payer: 59 | Admitting: Family Medicine

## 2012-11-06 ENCOUNTER — Encounter: Payer: Self-pay | Admitting: Family Medicine

## 2012-11-06 VITALS — BP 140/78 | HR 104 | Temp 98.4°F | Wt 230.6 lb

## 2012-11-06 DIAGNOSIS — R062 Wheezing: Secondary | ICD-10-CM

## 2012-11-06 DIAGNOSIS — J209 Acute bronchitis, unspecified: Secondary | ICD-10-CM

## 2012-11-06 MED ORDER — AZITHROMYCIN 250 MG PO TABS: ORAL_TABLET | ORAL | Status: DC

## 2012-11-06 MED ORDER — ALBUTEROL SULFATE (2.5 MG/3ML) 0.083% IN NEBU
2.5000 mg | INHALATION_SOLUTION | Freq: Once | RESPIRATORY_TRACT | Status: AC
  Administered 2012-11-06: 2.5 mg via RESPIRATORY_TRACT

## 2012-11-06 MED ORDER — GUAIFENESIN-CODEINE 100-10 MG/5ML PO SYRP: ORAL_SOLUTION | ORAL | Status: DC

## 2012-11-06 MED ORDER — PREDNISONE 10 MG PO TABS: ORAL_TABLET | ORAL | Status: DC

## 2012-11-06 MED ORDER — METHYLPREDNISOLONE ACETATE 80 MG/ML IJ SUSP
80.0000 mg | Freq: Once | INTRAMUSCULAR | Status: AC
  Administered 2012-11-06: 80 mg via INTRAMUSCULAR

## 2012-11-06 NOTE — Progress Notes (Signed)
  Subjective:     Diane Shaw is a 62 y.o. female here for evaluation of a cough. Onset of symptoms was 5 days ago. Symptoms have been gradually worsening since that time. The cough is productive and is aggravated by cold air, infection and reclining position. Associated symptoms include: fever, postnasal drip, shortness of breath, sputum production and wheezing. Patient does have a history of asthma. Patient does have a history of environmental allergens. Patient has not traveled recently. Patient does not have a history of smoking. Patient has not had a previous chest x-ray. Patient has not had a PPD done.  The following portions of the patient's history were reviewed and updated as appropriate: allergies, current medications, past family history, past medical history, past social history, past surgical history and problem list.  Review of Systems Pertinent items are noted in HPI.    Objective:    Oxygen saturation 97% on room air BP 140/78  Pulse 104  Temp(Src) 98.4 F (36.9 C) (Oral)  Wt 230 lb 9.6 oz (104.599 kg)  BMI 37.24 kg/m2  SpO2 97% General appearance: alert, cooperative, appears stated age and no distress Ears: normal TM's and external ear canals both ears Nose: Nares normal. Septum midline. Mucosa normal. No drainage or sinus tenderness. Throat: lips, mucosa, and tongue normal; teeth and gums normal Neck: no adenopathy, supple, symmetrical, trachea midline and thyroid not enlarged, symmetric, no tenderness/mass/nodules Lungs: diminished breath sounds bilaterally and wheezes bilaterally Heart: S1, S2 normal    Assessment:    Acute Bronchitis and Asthma    Plan:    Antibiotics per medication orders. Antitussives per medication orders. Avoid exposure to tobacco smoke and fumes. B-agonist inhaler. Call if shortness of breath worsens, blood in sputum, change in character of cough, development of fever or chills, inability to maintain nutrition and hydration. Avoid  exposure to tobacco smoke and fumes. Steroid inhaler as ordered. steroid taper and depo medrol

## 2012-11-06 NOTE — Patient Instructions (Signed)

## 2012-11-06 NOTE — Telephone Encounter (Signed)
Appointment Scheduled:  11/06/2012 14:45:00  Appointment Scheduled Provider:  Lelon Perla

## 2012-11-06 NOTE — Telephone Encounter (Signed)
Patient Information:  Caller Name: Aluel  Phone: [phone]  Patient: Diane Shaw, Diane Shaw  Gender: Female  DOB: April 08, 1951  Age: 62 Years  PCP: Lelon Perla.  Office Follow Up:  Does the office need to follow up with this patient?: No  Instructions For The Office: N/A  RN Note:  patient states mild wheezing present  Symptoms  Reason For Call & Symptoms: Cough - Productive yellow mucus  Reviewed Health History In EMR: Yes  Reviewed Medications In EMR: Yes  Reviewed Allergies In EMR: Yes  Reviewed Surgeries / Procedures: Yes  Date of Onset of Symptoms: 11/04/2012  Treatments Tried: Mucinex  Treatments Tried Worked: No  Guideline(s) Used:  Cough  Disposition Per Guideline:   Go to Office Now  Reason For Disposition Reached:   Wheezing is present  Advice Given:  Coughing Spasms:  Drink warm fluids. Inhale warm mist (Reason: both relax the airway and loosen up the phlegm).  Prevent Dehydration:  Drink adequate liquids.  Expected Course:   Viral bronchitis (chest cold) causes a cough that lasts 1 to 3 weeks. Sometimes you may cough up lots of phlegm (sputum, mucus). The mucus can normally be white, gray, yellow, or green.  Call Back If:  Difficulty breathing  Cough lasts more than 3 weeks  You become worse.  Appointment Scheduled:  11/06/2012 14:45:00 Appointment Scheduled Provider:  Lelon Perla

## 2013-02-23 ENCOUNTER — Other Ambulatory Visit: Payer: Self-pay | Admitting: Family Medicine

## 2013-04-15 ENCOUNTER — Other Ambulatory Visit: Payer: Self-pay | Admitting: Family Medicine

## 2013-06-11 ENCOUNTER — Telehealth: Payer: Self-pay | Admitting: Family Medicine

## 2013-06-11 NOTE — Telephone Encounter (Signed)
Patient states that she [facility] [place], [place]. They found that she had blood in her urine and patient says that these results were faxed to our office one day last week. Patient wants to know if Dr. Laury Axon has reviewed this and what she advises she do.

## 2013-06-12 NOTE — Telephone Encounter (Signed)
MSG left for the patient to call the office.      KP 

## 2013-06-12 NOTE — Telephone Encounter (Signed)
Results were put in your pink folder. Please advise     KP

## 2013-06-12 NOTE — Telephone Encounter (Signed)
Repeat 2 weeks

## 2013-06-12 NOTE — Telephone Encounter (Signed)
Dr.Lowne would like to repeat UA in 2 weeks,    KP

## 2013-06-12 NOTE — Telephone Encounter (Signed)
Patient is returning your call. Please call back at work number listed.

## 2013-06-14 NOTE — Telephone Encounter (Signed)
My-chart message sent      KP 

## 2013-06-17 ENCOUNTER — Other Ambulatory Visit (INDEPENDENT_AMBULATORY_CARE_PROVIDER_SITE_OTHER): Payer: 59

## 2013-06-17 DIAGNOSIS — N390 Urinary tract infection, site not specified: Secondary | ICD-10-CM

## 2013-06-17 LAB — POCT URINALYSIS DIPSTICK
Bilirubin, UA: NEGATIVE
Glucose, UA: NEGATIVE
Nitrite, UA: NEGATIVE
Spec Grav, UA: 1.02
pH, UA: 6

## 2013-06-20 LAB — URINE CULTURE: Colony Count: 15000

## 2013-06-21 ENCOUNTER — Encounter: Payer: Self-pay | Admitting: General Practice

## 2013-06-28 ENCOUNTER — Other Ambulatory Visit: Payer: 59

## 2013-07-29 ENCOUNTER — Ambulatory Visit (INDEPENDENT_AMBULATORY_CARE_PROVIDER_SITE_OTHER): Payer: 59 | Admitting: Family Medicine

## 2013-07-29 VITALS — BP 120/80 | HR 103 | Temp 97.7°F | Resp 18 | Ht 64.5 in | Wt 225.0 lb

## 2013-07-29 DIAGNOSIS — M25512 Pain in left shoulder: Secondary | ICD-10-CM

## 2013-07-29 DIAGNOSIS — M79609 Pain in unspecified limb: Secondary | ICD-10-CM

## 2013-07-29 DIAGNOSIS — M25519 Pain in unspecified shoulder: Secondary | ICD-10-CM

## 2013-07-29 MED ORDER — HYDROCODONE-ACETAMINOPHEN 5-325 MG PO TABS: 1.0000 | ORAL_TABLET | Freq: Four times a day (QID) | ORAL | Status: DC | PRN

## 2013-07-29 MED ORDER — PREDNISONE 20 MG PO TABS: ORAL_TABLET | ORAL | Status: DC

## 2013-07-29 NOTE — Progress Notes (Signed)
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 She says she does not having weakness or numbness in her left arm.  Patient had a similar episode in the past but resolved by itself.  Pain is worse when she turns her neck a little bit. She's able to move her arm in all directions without pain. She's noticed no rash.  Objective: No acute distress Patient has complete range of motion of her shoulder and neck. Patient is nontender in the shoulder with no bony abnormality.  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  Pain in joint, shoulder region, left - Plan: predniSONE (DELTASONE) 20 MG tablet  Signed, Elvina Sidle, MD

## 2013-08-01 ENCOUNTER — Other Ambulatory Visit: Payer: Self-pay

## 2013-08-01 ENCOUNTER — Ambulatory Visit: Payer: 59

## 2013-08-01 ENCOUNTER — Ambulatory Visit (INDEPENDENT_AMBULATORY_CARE_PROVIDER_SITE_OTHER): Payer: 59 | Admitting: Family Medicine

## 2013-08-01 VITALS — BP 142/80 | HR 92 | Temp 98.2°F | Resp 18 | Ht 63.5 in | Wt 223.0 lb

## 2013-08-01 DIAGNOSIS — S46912D Strain of unspecified muscle, fascia and tendon at shoulder and upper arm level, left arm, subsequent encounter: Secondary | ICD-10-CM

## 2013-08-01 DIAGNOSIS — M25519 Pain in unspecified shoulder: Secondary | ICD-10-CM

## 2013-08-01 DIAGNOSIS — M25512 Pain in left shoulder: Secondary | ICD-10-CM

## 2013-08-01 DIAGNOSIS — M542 Cervicalgia: Secondary | ICD-10-CM

## 2013-08-01 DIAGNOSIS — S161XXD Strain of muscle, fascia and tendon at neck level, subsequent encounter: Secondary | ICD-10-CM

## 2013-08-01 DIAGNOSIS — Z5189 Encounter for other specified aftercare: Secondary | ICD-10-CM

## 2013-08-01 MED ORDER — HYDROCODONE-ACETAMINOPHEN 5-325 MG PO TABS: 1.0000 | ORAL_TABLET | Freq: Four times a day (QID) | ORAL | Status: DC | PRN

## 2013-08-01 MED ORDER — PREDNISONE 20 MG PO TABS: ORAL_TABLET | ORAL | Status: DC

## 2013-08-01 MED ORDER — CYCLOBENZAPRINE HCL 5 MG PO TABS: 5.0000 mg | ORAL_TABLET | Freq: Three times a day (TID) | ORAL | Status: DC | PRN

## 2013-08-01 NOTE — Progress Notes (Signed)
[address]   [place], [place]  [address]   [phone] Subjective:    Patient ID: Diane Shaw, female    DOB: [birth year], 62 y.o.   MRN: [id number]  This chart was scribed for Ethelda Chick, MD by Blanchard Kelch, ED Scribe. The patient was seen in room 10. Patient's care was started at 3:22 PM.   HPI  Diane Shaw is a 62 y.o. female who presents to office for a 48 hour follow up. She was evaluated by Dr. Milus Glazier 2 days ago. She was treated with Prednisone and Hydrocodone for cervical neck strain for suspected C5 nerve root impingement. No x-rays were obtained at that time. The last cervical spine films were in 03/2012.  She states the her neck and left shoulder started hurting last week but worsened severely on 11/3, which is when she was seen by Dr. Milus Glazier. She was told by Dr. Milus Glazier that the pain should have significantly subsided within 48 hours, but she reports it is unchanged. She also reports mild tingling in her left fingers, worst in her thumb. She denies weakness in her left arm, fever or rash. She has been taking one Hydrocodone every six hours for the pain, with temporarily relief, as well as 3-4 Prednisone a day. The pain is currently a 6/10 in severity with the Hydrocodone. The pain doesn't keep her awake at night but is worst when she wakes up in the morning. She has had similar pain in the past, but nothing of this severity.  She has had to miss some work due to the pain. 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. She does a lot of twisting at work but doesn't have to do any extreme heavy lifting.  Her PCP is Dr. Laury Axon  Past Medical History  Diagnosis Date  . Asthma   . HTN (hypertension)    Past Surgical History  Procedure Laterality Date  . No past surgeries     Family History  Problem Relation Age of Onset  . Heart failure Sister   . Diabetes Sister   . Heart disease Sister     chf  . Diabetes Mother   . Hypertension Mother   . Heart  disease Mother     chf  . Heart disease Father 42    MI  . Hypertension Father   . Heart disease Brother   . Arthritis Sister   . Heart disease Brother     Cabg   History   Social History  . Marital Status: Single    Spouse Name: N/A    Number of Children: N/A  . Years of Education: N/A   Occupational History  . Not on file.   Social History Main Topics  . Smoking status: Never Smoker   . Smokeless tobacco: Never Used  . Alcohol Use: .6 - 1.2 oz/week    1-2 Glasses of wine per week  . Drug Use: No  . Sexual Activity: Yes    Partners: Male    Birth Control/ Protection: None   Other Topics Concern  . Not on file   Social History Narrative  . No narrative on file  No Known Allergies Current Outpatient Prescriptions on File Prior to Visit  Medication Sig Dispense Refill  . albuterol (PROVENTIL) (2.5 MG/3ML) 0.083% nebulizer solution inhale contents of 1 vial in nebulizer every 6 hours if needed for shortness of breath  225 mL  3  . atorvastatin (LIPITOR) 40 MG tablet  Take 1 tablet (40 mg total) by mouth at bedtime.  30 tablet  2  . diltiazem (CARDIZEM CD) 120 MG 24 hr capsule Take 1 capsule (120 mg total) by mouth daily.  30 capsule  2  . HYDROcodone-acetaminophen (NORCO) 5-325 MG per tablet Take 1 tablet by mouth every 6 (six) hours as needed for pain.  20 tablet  0  . labetalol (NORMODYNE) 100 MG tablet Take 1 tablet by mouth daily.      . mometasone-formoterol (DULERA) 200-5 MCG/ACT AERO 2 puffs bid  1 Inhaler  0  . Multiple Vitamins-Minerals (MULTIVITAMINS THER. W/MINERALS) TABS Take 1 tablet by mouth daily.        . predniSONE (DELTASONE) 20 MG tablet 2 daily with food  10 tablet  1  . PROAIR HFA 108 (90 BASE) MCG/ACT inhaler inhale 2 puffs by mouth every 6 hours if needed for shortness of breath  8.5 g  3  . vitamin B-12 (CYANOCOBALAMIN) 250 MCG tablet Take 250 mcg by mouth daily.      . zileuton (ZYFLO CR) 600 MG CR tablet Take 1,200 mg by mouth daily.        No  current facility-administered medications on file prior to visit.      Review of Systems  Constitutional: Negative for fever and chills.  Musculoskeletal: Positive for myalgias and neck pain.  Skin: Negative for rash.  Neurological: Positive for numbness. Negative for weakness.  Psychiatric/Behavioral: Negative for sleep disturbance.       Objective:   Physical Exam  Nursing note and vitals reviewed. Constitutional: She is oriented to person, place, and time. She appears well-developed and well-nourished. No distress.  HENT:  Head: Normocephalic and atraumatic.  Eyes: EOM are normal.  Neck: Normal range of motion. Neck supple. No tracheal deviation present.  Cardiovascular: Normal rate, regular rhythm and normal heart sounds.   No murmur heard. Pulmonary/Chest: Effort normal and breath sounds normal. No respiratory distress. She has no wheezes. She has no rales.  Musculoskeletal: Normal range of motion. She exhibits tenderness.  Mild pain with flexion, extension, rotation side to side and lateral bending of neck. Tender to palpation along left deltoid of shoulder. Full ROM of left shoulder without pain or limitation. Empty can sign was negative. Crossover of left shoulder negative. No midline TTP cervical spine; mild TTP L trapezius region.   Lymphadenopathy:    She has no cervical adenopathy.  Neurological: She is alert and oriented to person, place, and time.  Motor 5/5 bilateral upper extremities. Sensation grossly intact.   Skin: Skin is warm and dry.  Psychiatric: She has a normal mood and affect. Her behavior is normal.    [facility] reading (primary) by Dr. Katrinka Blazing: Left shoulder no acute disease. 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      Assessment & Plan:   Neck pain - Plan: DG Cervical Spine Complete  Pain in joint, shoulder region, left - Plan: DG Shoulder Left, predniSONE (DELTASONE) 20 MG tablet  Cervical strain, acute,  subsequent encounter  Shoulder strain, left, subsequent encounter  1. Neck cervical strain: Persistent; with radiculopathy; rx for Prednisone, Flexeril, hydrocodone provided.  Recommend heat to area; home exercise program provided. If no improvement in 7-10 days,patient to call for ortho referral. 2. L shoulder strain: persistent; medications prescribed; home exercise program provided; avoid lifting with arm for the next two weeks.  Meds ordered this encounter  Medications  . predniSONE (DELTASONE) 20 MG tablet  Sig: 2 daily x 5 days then 1 daily x 5 days    Dispense:  15 tablet    Refill:  0  . cyclobenzaprine (FLEXERIL) 5 MG tablet    Sig: Take 1 tablet (5 mg total) by mouth 3 (three) times daily as needed for muscle spasms.    Dispense:  40 tablet    Refill:  0  . HYDROcodone-acetaminophen (NORCO) 5-325 MG per tablet    Sig: Take 1 tablet by mouth every 6 (six) hours as needed.    Dispense:  30 tablet    Refill:  0    I personally performed the services described in this documentation, which was scribed in my presence.  The recorded information has been reviewed and is accurate.

## 2013-08-01 NOTE — Patient Instructions (Signed)

## 2014-04-29 ENCOUNTER — Encounter: Payer: Self-pay | Admitting: Gastroenterology

## 2014-07-11 ENCOUNTER — Other Ambulatory Visit: Payer: Self-pay

## 2015-05-14 ENCOUNTER — Telehealth: Payer: Self-pay | Admitting: Family Medicine

## 2015-05-14 NOTE — Telephone Encounter (Signed)
pre visit letter mailed 05/05/15

## 2015-05-25 ENCOUNTER — Other Ambulatory Visit: Payer: Self-pay

## 2015-05-26 ENCOUNTER — Other Ambulatory Visit (HOSPITAL_COMMUNITY)
Admission: RE | Admit: 2015-05-26 | Discharge: 2015-05-26 | Disposition: A | Discharge status: Home / Self Care | Payer: Medicare Other | Source: Ambulatory Visit | Attending: Family Medicine | Admitting: Family Medicine

## 2015-05-26 ENCOUNTER — Ambulatory Visit (INDEPENDENT_AMBULATORY_CARE_PROVIDER_SITE_OTHER): Payer: Self-pay | Admitting: Family Medicine

## 2015-05-26 ENCOUNTER — Encounter: Payer: Self-pay | Admitting: Family Medicine

## 2015-05-26 VITALS — BP 152/82 | HR 80 | Temp 98.0°F | Ht 64.0 in | Wt 235.4 lb

## 2015-05-26 DIAGNOSIS — Z124 Encounter for screening for malignant neoplasm of cervix: Secondary | ICD-10-CM

## 2015-05-26 DIAGNOSIS — Z1231 Encounter for screening mammogram for malignant neoplasm of breast: Secondary | ICD-10-CM

## 2015-05-26 DIAGNOSIS — E785 Hyperlipidemia, unspecified: Secondary | ICD-10-CM

## 2015-05-26 DIAGNOSIS — R319 Hematuria, unspecified: Secondary | ICD-10-CM

## 2015-05-26 DIAGNOSIS — Z0000 Encounter for general adult medical examination without abnormal findings: Secondary | ICD-10-CM

## 2015-05-26 DIAGNOSIS — R82998 Other abnormal findings in urine: Secondary | ICD-10-CM

## 2015-05-26 DIAGNOSIS — R8290 Unspecified abnormal findings in urine: Secondary | ICD-10-CM

## 2015-05-26 DIAGNOSIS — E2839 Other primary ovarian failure: Secondary | ICD-10-CM

## 2015-05-26 DIAGNOSIS — N390 Urinary tract infection, site not specified: Secondary | ICD-10-CM

## 2015-05-26 DIAGNOSIS — Z23 Encounter for immunization: Secondary | ICD-10-CM

## 2015-05-26 LAB — POCT URINALYSIS DIPSTICK
Bilirubin, UA: NEGATIVE
GLUCOSE UA: NEGATIVE
KETONES UA: NEGATIVE
Nitrite, UA: NEGATIVE
PROTEIN UA: NEGATIVE
SPEC GRAV UA: 1.015
UROBILINOGEN UA: 0.2
pH, UA: 6

## 2015-05-26 MED ORDER — AMLODIPINE BESYLATE 5 MG PO TABS: 5.0000 mg | ORAL_TABLET | Freq: Every day | ORAL | Status: DC

## 2015-05-26 MED ORDER — ADVAIR DISKUS 250-50 MCG/DOSE IN AEPB: 1.0000 | INHALATION_SPRAY | Freq: Two times a day (BID) | RESPIRATORY_TRACT | Status: DC

## 2015-05-26 MED ORDER — MONTELUKAST SODIUM 10 MG PO TABS: 10.0000 mg | ORAL_TABLET | Freq: Every day | ORAL | Status: DC

## 2015-05-26 MED ORDER — ALBUTEROL SULFATE HFA 108 (90 BASE) MCG/ACT IN AERS
INHALATION_SPRAY | RESPIRATORY_TRACT | Status: DC
Start: 2015-05-26 — End: 2020-10-30

## 2015-05-26 NOTE — Progress Notes (Signed)
Pre visit review using our clinic review tool, if applicable. No additional management support is needed unless otherwise documented below in the visit note. 

## 2015-05-26 NOTE — Patient Instructions (Addendum)
Your Bone Density and Mammogram has been scheduled for Sept 7, 2016 at 1 pm [place] Address: [address], [place], [place] [address]  Hours:   Tuesday 7AM-5PM  Wednesday 7AM-5PM  Thursday 7AM-5PM  Friday 7AM-5PM  Saturday Closed  Sunday Closed  Monday  (Labor Day) 7AM-6:30PM  Hours might differ  Phone: [phone]     Preventive Care for Adults A healthy lifestyle and preventive care can promote health and wellness. Preventive health guidelines for women include the following key practices.  A routine yearly physical is a good way to check with your health care provider about your health and preventive screening. It is a chance to share any concerns and updates on your health and to receive a thorough exam.  Visit your dentist for a routine exam and preventive care every 6 months. Brush your teeth twice a day and floss once a day. Good oral hygiene prevents tooth decay and gum disease.  The frequency of eye exams is based on your age, health, family medical history, use of contact lenses, and other factors. Follow your health care provider's recommendations for frequency of eye exams.  Eat a healthy diet. Foods like vegetables, fruits, whole grains, low-fat dairy products, and lean protein foods contain the nutrients you need without too many calories. Decrease your intake of foods high in solid fats, added sugars, and salt. Eat the right amount of calories for you.Get information about a proper diet from your health care provider, if necessary.  Regular physical exercise is one of the most important things you can do for your health. Most adults should get at least 150 minutes of moderate-intensity exercise (any activity that increases your heart rate and causes you to sweat) each week. In addition, most adults need muscle-strengthening exercises on 2 or more days a week.  Maintain a healthy weight. The body mass index (BMI) is a screening tool to identify  possible weight problems. It provides an estimate of body fat based on height and weight. Your health care provider can find your BMI and can help you achieve or maintain a healthy weight.For adults 20 years and older:  A BMI below 18.5 is considered underweight.  A BMI of 18.5 to 24.9 is normal.  A BMI of 25 to 29.9 is considered overweight.  A BMI of 30 and above is considered obese.  Maintain normal blood lipids and cholesterol levels by exercising and minimizing your intake of saturated fat. Eat a balanced diet with plenty of fruit and vegetables. Blood tests for lipids and cholesterol should begin at age 70 and be repeated every 5 years. If your lipid or cholesterol levels are high, you are over 50, or you are at high risk for heart disease, you may need your cholesterol levels checked more frequently.Ongoing high lipid and cholesterol levels should be treated with medicines if diet and exercise are not working.  If you smoke, find out from your health care provider how to quit. If you do not use tobacco, do not start.  Lung cancer screening is recommended for adults aged 48-80 years who are at high risk for developing lung cancer because of a history of smoking. A yearly low-dose CT scan of the lungs is recommended for people who have at least a 30-pack-year history of smoking and are a current smoker or have quit within the past 15 years. A pack year of smoking is smoking an average of 1 pack of cigarettes a day for 1  year (for example: 1 pack a day for 30 years or 2 packs a day for 15 years). Yearly screening should continue until the smoker has stopped smoking for at least 15 years. Yearly screening should be stopped for people who develop a health problem that would prevent them from having lung cancer treatment.  If you are pregnant, do not drink alcohol. If you are breastfeeding, be very cautious about drinking alcohol. If you are not pregnant and choose to drink alcohol, do not have  more than 1 drink per day. One drink is considered to be 12 ounces (355 mL) of beer, 5 ounces (148 mL) of wine, or 1.5 ounces (44 mL) of liquor.  Avoid use of street drugs. Do not share needles with anyone. Ask for help if you need support or instructions about stopping the use of drugs.  High blood pressure causes heart disease and increases the risk of stroke. Your blood pressure should be checked at least every 1 to 2 years. Ongoing high blood pressure should be treated with medicines if weight loss and exercise do not work.  If you are 109-37 years old, ask your health care provider if you should take aspirin to prevent strokes.  Diabetes screening involves taking a blood sample to check your fasting blood sugar level. This should be done once every 3 years, after age 81, if you are within normal weight and without risk factors for diabetes. Testing should be considered at a younger age or be carried out more frequently if you are overweight and have at least 1 risk factor for diabetes.  Breast cancer screening is essential preventive care for women. You should practice "breast self-awareness." This means understanding the normal appearance and feel of your breasts and may include breast self-examination. Any changes detected, no matter how small, should be reported to a health care provider. Women in their 95s and 30s should have a clinical breast exam (CBE) by a health care provider as part of a regular health exam every 1 to 3 years. After age 62, women should have a CBE every year. Starting at age 18, women should consider having a mammogram (breast X-ray test) every year. Women who have a family history of breast cancer should talk to their health care provider about genetic screening. Women at a high risk of breast cancer should talk to their health care providers about having an MRI and a mammogram every year.  Breast cancer gene (BRCA)-related cancer risk assessment is recommended for women  who have family members with BRCA-related cancers. BRCA-related cancers include breast, ovarian, tubal, and peritoneal cancers. Having family members with these cancers may be associated with an increased risk for harmful changes (mutations) in the breast cancer genes BRCA1 and BRCA2. Results of the assessment will determine the need for genetic counseling and BRCA1 and BRCA2 testing.  Routine pelvic exams to screen for cancer are no longer recommended for nonpregnant women who are considered low risk for cancer of the pelvic organs (ovaries, uterus, and vagina) and who do not have symptoms. Ask your health care provider if a screening pelvic exam is right for you.  If you have had past treatment for cervical cancer or a condition that could lead to cancer, you need Pap tests and screening for cancer for at least 20 years after your treatment. If Pap tests have been discontinued, your risk factors (such as having a new sexual partner) need to be reassessed to determine if screening should be resumed. Some women  have medical problems that increase the chance of getting cervical cancer. In these cases, your health care provider may recommend more frequent screening and Pap tests.  The HPV test is an additional test that may be used for cervical cancer screening. The HPV test looks for the virus that can cause the cell changes on the cervix. The cells collected during the Pap test can be tested for HPV. The HPV test could be used to screen women aged 58 years and older, and should be used in women of any age who have unclear Pap test results. After the age of 43, women should have HPV testing at the same frequency as a Pap test.  Colorectal cancer can be detected and often prevented. Most routine colorectal cancer screening begins at the age of 25 years and continues through age 43 years. However, your health care provider may recommend screening at an earlier age if you have risk factors for colon cancer. On a  yearly basis, your health care provider may provide home test kits to check for hidden blood in the stool. Use of a small camera at the end of a tube, to directly examine the colon (sigmoidoscopy or colonoscopy), can detect the earliest forms of colorectal cancer. Talk to your health care provider about this at age 53, when routine screening begins. Direct exam of the colon should be repeated every 5-10 years through age 67 years, unless early forms of pre-cancerous polyps or small growths are found.  People who are at an increased risk for hepatitis B should be screened for this virus. You are considered at high risk for hepatitis B if:  You were born in a country where hepatitis B occurs often. Talk with your health care provider about which countries are considered high risk.  Your parents were born in a high-risk country and you have not received a shot to protect against hepatitis B (hepatitis B vaccine).  You have HIV or AIDS.  You use needles to inject street drugs.  You live with, or have sex with, someone who has hepatitis B.  You get hemodialysis treatment.  You take certain medicines for conditions like cancer, organ transplantation, and autoimmune conditions.  Hepatitis C blood testing is recommended for all people born from [birth year] through 1965 and any individual with known risks for hepatitis C.  Practice safe sex. Use condoms and avoid high-risk sexual practices to reduce the spread of sexually transmitted infections (STIs). STIs include gonorrhea, chlamydia, syphilis, trichomonas, herpes, HPV, and human immunodeficiency virus (HIV). Herpes, HIV, and HPV are viral illnesses that have no cure. They can result in disability, cancer, and death.  You should be screened for sexually transmitted illnesses (STIs) including gonorrhea and chlamydia if:  You are sexually active and are younger than 24 years.  You are older than 24 years and your health care provider tells you that you  are at risk for this type of infection.  Your sexual activity has changed since you were last screened and you are at an increased risk for chlamydia or gonorrhea. Ask your health care provider if you are at risk.  If you are at risk of being infected with HIV, it is recommended that you take a prescription medicine daily to prevent HIV infection. This is called preexposure prophylaxis (PrEP). You are considered at risk if:  You are a heterosexual woman, are sexually active, and are at increased risk for HIV infection.  You take drugs by injection.  You are sexually active  with a partner who has HIV.  Talk with your health care provider about whether you are at high risk of being infected with HIV. If you choose to begin PrEP, you should first be tested for HIV. You should then be tested every 3 months for as long as you are taking PrEP.  Osteoporosis is a disease in which the bones lose minerals and strength with aging. This can result in serious bone fractures or breaks. The risk of osteoporosis can be identified using a bone density scan. Women ages 28 years and over and women at risk for fractures or osteoporosis should discuss screening with their health care providers. Ask your health care provider whether you should take a calcium supplement or vitamin D to reduce the rate of osteoporosis.  Menopause can be associated with physical symptoms and risks. Hormone replacement therapy is available to decrease symptoms and risks. You should talk to your health care provider about whether hormone replacement therapy is right for you.  Use sunscreen. Apply sunscreen liberally and repeatedly throughout the day. You should seek shade when your shadow is shorter than you. Protect yourself by wearing long sleeves, pants, a wide-brimmed hat, and sunglasses year round, whenever you are outdoors.  Once a month, do a whole body skin exam, using a mirror to look at the skin on your back. Tell your health  care provider of new moles, moles that have irregular borders, moles that are larger than a pencil eraser, or moles that have changed in shape or color.  Stay current with required vaccines (immunizations).  Influenza vaccine. All adults should be immunized every year.  Tetanus, diphtheria, and acellular pertussis (Td, Tdap) vaccine. Pregnant women should receive 1 dose of Tdap vaccine during each pregnancy. The dose should be obtained regardless of the length of time since the last dose. Immunization is preferred during the 27th-36th week of gestation. An adult who has not previously received Tdap or who does not know her vaccine status should receive 1 dose of Tdap. This initial dose should be followed by tetanus and diphtheria toxoids (Td) booster doses every 10 years. Adults with an unknown or incomplete history of completing a 3-dose immunization series with Td-containing vaccines should begin or complete a primary immunization series including a Tdap dose. Adults should receive a Td booster every 10 years.  Varicella vaccine. An adult without evidence of immunity to varicella should receive 2 doses or a second dose if she has previously received 1 dose. Pregnant females who do not have evidence of immunity should receive the first dose after pregnancy. This first dose should be obtained before leaving the health care facility. The second dose should be obtained 4-8 weeks after the first dose.  Human papillomavirus (HPV) vaccine. Females aged 13-26 years who have not received the vaccine previously should obtain the 3-dose series. The vaccine is not recommended for use in pregnant females. However, pregnancy testing is not needed before receiving a dose. If a female is found to be pregnant after receiving a dose, no treatment is needed. In that case, the remaining doses should be delayed until after the pregnancy. Immunization is recommended for any person with an immunocompromised condition through  the age of 76 years if she did not get any or all doses earlier. During the 3-dose series, the second dose should be obtained 4-8 weeks after the first dose. The third dose should be obtained 24 weeks after the first dose and 16 weeks after the second dose.  Zoster  vaccine. One dose is recommended for adults aged 73 years or older unless certain conditions are present.  Measles, mumps, and rubella (MMR) vaccine. Adults born before 60 generally are considered immune to measles and mumps. Adults born in [birth year] or later should have 1 or more doses of MMR vaccine unless there is a contraindication to the vaccine or there is laboratory evidence of immunity to each of the three diseases. A routine second dose of MMR vaccine should be obtained at least 28 days after the first dose for students attending postsecondary schools, health care workers, or international travelers. People who received inactivated measles vaccine or an unknown type of measles vaccine during 1963-1967 should receive 2 doses of MMR vaccine. People who received inactivated mumps vaccine or an unknown type of mumps vaccine before 1979 and are at high risk for mumps infection should consider immunization with 2 doses of MMR vaccine. For females of childbearing age, rubella immunity should be determined. If there is no evidence of immunity, females who are not pregnant should be vaccinated. If there is no evidence of immunity, females who are pregnant should delay immunization until after pregnancy. Unvaccinated health care workers born before [birth year] who lack laboratory evidence of measles, mumps, or rubella immunity or laboratory confirmation of disease should consider measles and mumps immunization with 2 doses of MMR vaccine or rubella immunization with 1 dose of MMR vaccine.  Pneumococcal 13-valent conjugate (PCV13) vaccine. When indicated, a person who is uncertain of her immunization history and has no record of immunization should receive the  PCV13 vaccine. An adult aged 50 years or older who has certain medical conditions and has not been previously immunized should receive 1 dose of PCV13 vaccine. This PCV13 should be followed with a dose of pneumococcal polysaccharide (PPSV23) vaccine. The PPSV23 vaccine dose should be obtained at least 8 weeks after the dose of PCV13 vaccine. An adult aged 44 years or older who has certain medical conditions and previously received 1 or more doses of PPSV23 vaccine should receive 1 dose of PCV13. The PCV13 vaccine dose should be obtained 1 or more years after the last PPSV23 vaccine dose.  Pneumococcal polysaccharide (PPSV23) vaccine. When PCV13 is also indicated, PCV13 should be obtained first. All adults aged 66 years and older should be immunized. An adult younger than age 52 years who has certain medical conditions should be immunized. Any person who resides in a nursing home or long-term care facility should be immunized. An adult smoker should be immunized. People with an immunocompromised condition and certain other conditions should receive both PCV13 and PPSV23 vaccines. People with human immunodeficiency virus (HIV) infection should be immunized as soon as possible after diagnosis. Immunization during chemotherapy or radiation therapy should be avoided. Routine use of PPSV23 vaccine is not recommended for American Indians, [place] Natives, or people younger than 65 years unless there are medical conditions that require PPSV23 vaccine. When indicated, people who have unknown immunization and have no record of immunization should receive PPSV23 vaccine. One-time revaccination 5 years after the first dose of PPSV23 is recommended for people aged 19-64 years who have chronic kidney failure, nephrotic syndrome, asplenia, or immunocompromised conditions. People who received 1-2 doses of PPSV23 before age 24 years should receive another dose of PPSV23 vaccine at age 28 years or later if at least 5 years have  passed since the previous dose. Doses of PPSV23 are not needed for people immunized with PPSV23 at or after age 40 years.  Meningococcal vaccine.  Adults with asplenia or persistent complement component deficiencies should receive 2 doses of quadrivalent meningococcal conjugate (MenACWY-D) vaccine. The doses should be obtained at least 2 months apart. Microbiologists working with certain meningococcal bacteria, [employer] recruits, people at risk during an outbreak, and people who travel to or live in countries with a high rate of meningitis should be immunized. A first-year college student up through age 42 years who is living in a residence hall should receive a dose if she did not receive a dose on or after her 16th birthday. Adults who have certain high-risk conditions should receive one or more doses of vaccine.  Hepatitis A vaccine. Adults who wish to be protected from this disease, have certain high-risk conditions, work with hepatitis A-infected animals, work in hepatitis A research labs, or travel to or work in countries with a high rate of hepatitis A should be immunized. Adults who were previously unvaccinated and who anticipate close contact with an international adoptee during the first 60 days after arrival in the [place] from a country with a high rate of hepatitis A should be immunized.  Hepatitis B vaccine. Adults who wish to be protected from this disease, have certain high-risk conditions, may be exposed to blood or other infectious body fluids, are household contacts or sex partners of hepatitis B positive people, are clients or workers in certain care facilities, or travel to or work in countries with a high rate of hepatitis B should be immunized.  Haemophilus influenzae type b (Hib) vaccine. A previously unvaccinated person with asplenia or sickle cell disease or having a scheduled splenectomy should receive 1 dose of Hib vaccine. Regardless of previous immunization, a recipient of  a hematopoietic stem cell transplant should receive a 3-dose series 6-12 months after her successful transplant. Hib vaccine is not recommended for adults with HIV infection. Preventive Services / Frequency Ages 85 to 60 years  Blood pressure check.** / Every 1 to 2 years.  Lipid and cholesterol check.** / Every 5 years beginning at age 41.  Clinical breast exam.** / Every 3 years for women in their 26s and 52s.  BRCA-related cancer risk assessment.** / For women who have family members with a BRCA-related cancer (breast, ovarian, tubal, or peritoneal cancers).  Pap test.** / Every 2 years from ages 23 through 68. Every 3 years starting at age 41 through age 66 or 32 with a history of 3 consecutive normal Pap tests.  HPV screening.** / Every 3 years from ages 29 through ages 6 to 23 with a history of 3 consecutive normal Pap tests.  Hepatitis C blood test.** / For any individual with known risks for hepatitis C.  Skin self-exam. / Monthly.  Influenza vaccine. / Every year.  Tetanus, diphtheria, and acellular pertussis (Tdap, Td) vaccine.** / Consult your health care provider. Pregnant women should receive 1 dose of Tdap vaccine during each pregnancy. 1 dose of Td every 10 years.  Varicella vaccine.** / Consult your health care provider. Pregnant females who do not have evidence of immunity should receive the first dose after pregnancy.  HPV vaccine. / 3 doses over 6 months, if 9 and younger. The vaccine is not recommended for use in pregnant females. However, pregnancy testing is not needed before receiving a dose.  Measles, mumps, rubella (MMR) vaccine.** / You need at least 1 dose of MMR if you were born in 1957 or later. You may also need a 2nd dose. For females of childbearing age, rubella immunity should be determined.  If there is no evidence of immunity, females who are not pregnant should be vaccinated. If there is no evidence of immunity, females who are pregnant should delay  immunization until after pregnancy.  Pneumococcal 13-valent conjugate (PCV13) vaccine.** / Consult your health care provider.  Pneumococcal polysaccharide (PPSV23) vaccine.** / 1 to 2 doses if you smoke cigarettes or if you have certain conditions.  Meningococcal vaccine.** / 1 dose if you are age 45 to 51 years and a Market researcher living in a residence hall, or have one of several medical conditions, you need to get vaccinated against meningococcal disease. You may also need additional booster doses.  Hepatitis A vaccine.** / Consult your health care provider.  Hepatitis B vaccine.** / Consult your health care provider.  Haemophilus influenzae type b (Hib) vaccine.** / Consult your health care provider. Ages 51 to 37 years  Blood pressure check.** / Every 1 to 2 years.  Lipid and cholesterol check.** / Every 5 years beginning at age 74 years.  Lung cancer screening. / Every year if you are aged 53-80 years and have a 30-pack-year history of smoking and currently smoke or have quit within the past 15 years. Yearly screening is stopped once you have quit smoking for at least 15 years or develop a health problem that would prevent you from having lung cancer treatment.  Clinical breast exam.** / Every year after age 58 years.  BRCA-related cancer risk assessment.** / For women who have family members with a BRCA-related cancer (breast, ovarian, tubal, or peritoneal cancers).  Mammogram.** / Every year beginning at age 84 years and continuing for as long as you are in good health. Consult with your health care provider.  Pap test.** / Every 3 years starting at age 64 years through age 24 or 74 years with a history of 3 consecutive normal Pap tests.  HPV screening.** / Every 3 years from ages 78 years through ages 68 to 55 years with a history of 3 consecutive normal Pap tests.  Fecal occult blood test (FOBT) of stool. / Every year beginning at age 107 years and continuing  until age 25 years. You may not need to do this test if you get a colonoscopy every 10 years.  Flexible sigmoidoscopy or colonoscopy.** / Every 5 years for a flexible sigmoidoscopy or every 10 years for a colonoscopy beginning at age 30 years and continuing until age 28 years.  Hepatitis C blood test.** / For all people born from [birth year] through 1966 and any individual with known risks for hepatitis C.  Skin self-exam. / Monthly.  Influenza vaccine. / Every year.  Tetanus, diphtheria, and acellular pertussis (Tdap/Td) vaccine.** / Consult your health care provider. Pregnant women should receive 1 dose of Tdap vaccine during each pregnancy. 1 dose of Td every 10 years.  Varicella vaccine.** / Consult your health care provider. Pregnant females who do not have evidence of immunity should receive the first dose after pregnancy.  Zoster vaccine.** / 1 dose for adults aged 40 years or older.  Measles, mumps, rubella (MMR) vaccine.** / You need at least 1 dose of MMR if you were born in 1957 or later. You may also need a 2nd dose. For females of childbearing age, rubella immunity should be determined. If there is no evidence of immunity, females who are not pregnant should be vaccinated. If there is no evidence of immunity, females who are pregnant should delay immunization until after pregnancy.  Pneumococcal 13-valent conjugate (PCV13) vaccine.** / Consult  your health care provider.  Pneumococcal polysaccharide (PPSV23) vaccine.** / 1 to 2 doses if you smoke cigarettes or if you have certain conditions.  Meningococcal vaccine.** / Consult your health care provider.  Hepatitis A vaccine.** / Consult your health care provider.  Hepatitis B vaccine.** / Consult your health care provider.  Haemophilus influenzae type b (Hib) vaccine.** / Consult your health care provider. Ages 55 years and over  Blood pressure check.** / Every 1 to 2 years.  Lipid and cholesterol check.** / Every 5 years  beginning at age 6 years.  Lung cancer screening. / Every year if you are aged 29-80 years and have a 30-pack-year history of smoking and currently smoke or have quit within the past 15 years. Yearly screening is stopped once you have quit smoking for at least 15 years or develop a health problem that would prevent you from having lung cancer treatment.  Clinical breast exam.** / Every year after age 74 years.  BRCA-related cancer risk assessment.** / For women who have family members with a BRCA-related cancer (breast, ovarian, tubal, or peritoneal cancers).  Mammogram.** / Every year beginning at age 13 years and continuing for as long as you are in good health. Consult with your health care provider.  Pap test.** / Every 3 years starting at age 19 years through age 68 or 81 years with 3 consecutive normal Pap tests. Testing can be stopped between 65 and 70 years with 3 consecutive normal Pap tests and no abnormal Pap or HPV tests in the past 10 years.  HPV screening.** / Every 3 years from ages 74 years through ages 39 or 70 years with a history of 3 consecutive normal Pap tests. Testing can be stopped between 65 and 70 years with 3 consecutive normal Pap tests and no abnormal Pap or HPV tests in the past 10 years.  Fecal occult blood test (FOBT) of stool. / Every year beginning at age 22 years and continuing until age 18 years. You may not need to do this test if you get a colonoscopy every 10 years.  Flexible sigmoidoscopy or colonoscopy.** / Every 5 years for a flexible sigmoidoscopy or every 10 years for a colonoscopy beginning at age 28 years and continuing until age 34 years.  Hepatitis C blood test.** / For all people born from [birth year] through 1966 and any individual with known risks for hepatitis C.  Osteoporosis screening.** / A one-time screening for women ages 37 years and over and women at risk for fractures or osteoporosis.  Skin self-exam. / Monthly.  Influenza vaccine. /  Every year.  Tetanus, diphtheria, and acellular pertussis (Tdap/Td) vaccine.** / 1 dose of Td every 10 years.  Varicella vaccine.** / Consult your health care provider.  Zoster vaccine.** / 1 dose for adults aged 58 years or older.  Pneumococcal 13-valent conjugate (PCV13) vaccine.** / Consult your health care provider.  Pneumococcal polysaccharide (PPSV23) vaccine.** / 1 dose for all adults aged 70 years and older.  Meningococcal vaccine.** / Consult your health care provider.  Hepatitis A vaccine.** / Consult your health care provider.  Hepatitis B vaccine.** / Consult your health care provider.  Haemophilus influenzae type b (Hib) vaccine.** / Consult your health care provider. ** Family history and personal history of risk and conditions may change your health care provider's recommendations. Document Released: 11/08/2001 Document Revised: 01/27/2014 Document Reviewed: 02/07/2011 [facility] Patient Information 2015 [place], [place]. This information is not intended to replace advice given to you by your health care  provider. Make sure you discuss any questions you have with your health care provider.

## 2015-05-26 NOTE — Progress Notes (Deleted)
  Subjective:     Diane Shaw is a 64 y.o. female and is here for a comprehensive physical exam. The patient reports {problems:16946}.  Social History   Social History  . Marital Status: Single    Spouse Name: N/A  . Number of Children: N/A  . Years of Education: N/A   Occupational History  . Not on file.   Social History Main Topics  . Smoking status: Never Smoker   . Smokeless tobacco: Never Used  . Alcohol Use: 0.6 - 1.2 oz/week    1-2 Glasses of wine per week  . Drug Use: No  . Sexual Activity:    Partners: Male    Birth Control/ Protection: None   Other Topics Concern  . Not on file   Social History Narrative   Health Maintenance  Topic Date Due  . Hepatitis C Screening  Aug 01, 1951  . HIV Screening  04/15/1965  . MAMMOGRAM  11/04/2012  . PNA vac Low Risk Adult (1 of 2 - PCV13) 04/16/2015  . INFLUENZA VACCINE  04/27/2015  . TETANUS/TDAP  04/09/2018  . COLONOSCOPY  10/16/2020  . DEXA SCAN  Completed  . ZOSTAVAX  Completed    {Common ambulatory SmartLinks:19316}  Review of Systems {ros; complete:30496}   Objective:    {Exam, Complete:[phone]}    Assessment:    Healthy female exam. ***     Plan:     See After Visit Summary for Counseling Recommendations

## 2015-05-26 NOTE — Progress Notes (Signed)
Subjective:    Diane Shaw is a 64 y.o. female who presents for a Welcome to Medicare exam.   Review of Systems  Review of Systems  Constitutional: Negative for activity change, appetite change and fatigue.  HENT: Negative for hearing loss, congestion, tinnitus and ear discharge.   Eyes: Negative for visual disturbance (see optho q1y -- vision corrected to 20/20 with glasses).  Respiratory: Negative for cough, chest tightness and shortness of breath.   Cardiovascular: Negative for chest pain, palpitations and leg swelling.  Gastrointestinal: Negative for abdominal pain, diarrhea, constipation and abdominal distention.  Genitourinary: Negative for urgency, frequency, decreased urine volume and difficulty urinating.  Musculoskeletal: Negative for back pain, arthralgias and gait problem.  Skin: Negative for color change, pallor and rash.  Neurological: Negative for dizziness, light-headedness, numbness and headaches.  Hematological: Negative for adenopathy. Does not bruise/bleed easily.  Psychiatric/Behavioral: Negative for suicidal ideas, confusion, sleep disturbance, self-injury, dysphoric mood, decreased concentration and agitation.  Pt is able to read and write and can do all ADLs No risk for falling No abuse/ violence in home            Objective:    Today's Vitals   05/26/15 1352  BP: 152/82  Pulse: 80  Temp: 98 F (36.7 C)  TempSrc: Oral  Height: 5\' 4"  (1.626 m)  Weight: 235 lb 6.4 oz (106.777 kg)  SpO2: 98%  Body mass index is 40.39 kg/(m^2). BP 152/82 mmHg  Pulse 80  Temp(Src) 98 F (36.7 C) (Oral)  Ht 5\' 4"  (1.626 m)  Wt 235 lb 6.4 oz (106.777 kg)  BMI 40.39 kg/m2  SpO2 98% General appearance: alert, cooperative, appears stated age and no distress Head: Normocephalic, without obvious abnormality, atraumatic Eyes: conjunctivae/corneas clear. PERRL, EOM's intact. Fundi benign. Ears: normal TM's and external ear canals both ears Nose: Nares normal.  Septum midline. Mucosa normal. No drainage or sinus tenderness. Throat: lips, mucosa, and tongue normal; teeth and gums normal Neck: no adenopathy, no carotid bruit, no JVD, supple, symmetrical, trachea midline and thyroid not enlarged, symmetric, no tenderness/mass/nodules Back: symmetric, no curvature. ROM normal. No CVA tenderness.2 Lungs: clear to auscultation bilaterally Breasts: normal appearance, no masses or tenderness Heart: regular rate and rhythm, S1, S2 normal, no murmur, click, rub or gallop Abdomen: soft, non-tender; bowel sounds normal; no masses,  no organomegaly Pelvic: deferred Extremities: extremities normal, atraumatic, no cyanosis or edema Pulses: 2+ and symmetric Skin: Skin color, texture, turgor normal. No rashes or lesions Lymph nodes: Cervical, supraclavicular, and axillary nodes normal. Neurologic: Alert and oriented X 3, normal strength and tone. Normal symmetric reflexes. Normal coordination and gaitPSYCH--- NORMAL Medications Outpatient Encounter Prescriptions as of 05/26/2015  Medication Sig  . ADVAIR DISKUS 250-50 MCG/DOSE AEPB Inhale 1 puff into the lungs 2 (two) times daily.  Marland Kitchen albuterol (PROVENTIL) (2.5 MG/3ML) 0.083% nebulizer solution inhale contents of 1 vial in nebulizer every 6 hours if needed for shortness of breath  . Misc Natural Products (OSTEO BI-FLEX JOINT SHIELD PO) Take 1 tablet by mouth daily.  . Multiple Vitamins-Minerals (MULTIVITAMINS THER. W/MINERALS) TABS Take 1 tablet by mouth daily.    . Probiotic Product ([place]) CAPS Take 1 capsule by mouth daily.  . [DISCONTINUED] ADVAIR DISKUS 250-50 MCG/DOSE AEPB Take 1 puff by mouth 2 (two) times daily.  . [DISCONTINUED] montelukast (SINGULAIR) 10 MG tablet Take 1 tablet by mouth at bedtime.  . [DISCONTINUED] PROAIR HFA 108 (90 BASE) MCG/ACT inhaler inhale 2 puffs by mouth every  6 hours if needed for shortness of breath  . albuterol (PROAIR HFA) 108 (90 BASE) MCG/ACT inhaler inhale 2  puffs by mouth every 6 hours if needed for shortness of breath  . amLODipine (NORVASC) 5 MG tablet Take 1 tablet (5 mg total) by mouth daily.  . montelukast (SINGULAIR) 10 MG tablet Take 1 tablet (10 mg total) by mouth at bedtime.  . [DISCONTINUED] atorvastatin (LIPITOR) 40 MG tablet Take 1 tablet (40 mg total) by mouth at bedtime.  . [DISCONTINUED] cyclobenzaprine (FLEXERIL) 5 MG tablet Take 1 tablet (5 mg total) by mouth 3 (three) times daily as needed for muscle spasms.  . [DISCONTINUED] zileuton (ZYFLO CR) 600 MG CR tablet Take 1,200 mg by mouth daily.    No facility-administered encounter medications on file as of 05/26/2015.     History: Past Medical History  Diagnosis Date  . Asthma   . HTN (hypertension)    Past Surgical History  Procedure Laterality Date  . No past surgeries      Family History  Problem Relation Age of Onset  . Heart failure Sister   . Diabetes Sister   . Heart disease Sister     chf  . Diabetes Mother   . Hypertension Mother   . Heart disease Mother     chf  . Heart disease Father 43    MI  . Hypertension Father   . Heart disease Brother   . Arthritis Sister   . Heart disease Brother     Cabg   Social History   Occupational History  . Not on file.   Social History Main Topics  . Smoking status: Never Smoker   . Smokeless tobacco: Never Used  . Alcohol Use: 0.6 - 1.2 oz/week    1-2 Glasses of wine per week  . Drug Use: No  . Sexual Activity:    Partners: Male    Birth Control/ Protection: None    Tobacco Counseling Counseling given: Not Answered   Immunizations and Health Maintenance Immunization History  Administered Date(s) Administered  . Influenza Split 10/17/2011, 07/24/2012, 07/24/2014  . Influenza Whole 07/08/2009  . Pneumococcal Polysaccharide-23 10/17/2011  . Td 04/09/2008  . Zoster 10/17/2011   Health Maintenance Due  Topic Date Due  . Hepatitis C Screening  Mar 14, 1951  . HIV Screening  04/15/1965  . MAMMOGRAM   11/04/2012  . PNA vac Low Risk Adult (1 of 2 - PCV13) 04/16/2015  . INFLUENZA VACCINE  04/27/2015    Activities of Daily Living In your present state of health, do you have any difficulty performing the following activities: 05/26/2015  Hearing? N  Vision? N  Difficulty concentrating or making decisions? N  Walking or climbing stairs? N  Dressing or bathing? N  Doing errands, shopping? N    Physical Exam  (optional), or other factors deemed appropriate based on the beneficiary's medical and social history and current clinical standards.  Advanced Directives:      Assessment:    This is a routine wellness examination for this patient .   Vision/Hearing screen [place] normal Vision per opth  Dietary issues and exercise activities discussed: d/w pt walking     Goals    None     Depression Screen PHQ 2/9 Scores 05/26/2015  PHQ - 2 Score 0     Fall Risk Fall Risk  05/26/2015  Falls in the past year? No    MMSE: No flowsheet data found.  Patient Care Team: Rosalita Chessman, DO as  PCP - General     Plan:     During the course of the visit the patient was educated and counseled about the following appropriate screening and preventive services:   Vaccines to include Pneumoccal, Influenza, Hepatitis B, Td, Zostavax, HCV  Electrocardiogram  Cardiovascular Disease  Colorectal cancer screening  Bone density screening  Diabetes screening  Glaucoma screening  Mammography/PAP  Nutrition counseling  Her current medications and allergies were reviewed and needed refills of her chronic medications were ordered. The plan for yearly health maintenance was discussed and all orders and referrals were made as appropriate.  Patient Instructions (the written plan) was given to the patient.   Elizabeth Sauer, LPN 10/30/5595

## 2015-05-27 LAB — CBC WITH DIFFERENTIAL/PLATELET
Basophils Absolute: 0 10*3/uL (ref 0.0–0.1)
Basophils Relative: 0.5 % (ref 0.0–3.0)
Eosinophils Absolute: 0.3 10*3/uL (ref 0.0–0.7)
Eosinophils Relative: 3.1 % (ref 0.0–5.0)
HCT: 36 % (ref 36.0–46.0)
Hemoglobin: 11.8 g/dL — ABNORMAL LOW (ref 12.0–15.0)
Lymphocytes Relative: 23.8 % (ref 12.0–46.0)
Lymphs Abs: 2.1 10*3/uL (ref 0.7–4.0)
MCHC: 32.8 g/dL (ref 30.0–36.0)
MCV: 83.4 fl (ref 78.0–100.0)
Monocytes Absolute: 0.4 10*3/uL (ref 0.1–1.0)
Monocytes Relative: 4.3 % (ref 3.0–12.0)
Neutro Abs: 5.9 10*3/uL (ref 1.4–7.7)
Neutrophils Relative %: 68.3 % (ref 43.0–77.0)
Platelets: 279 10*3/uL (ref 150.0–400.0)
RBC: 4.32 Mil/uL (ref 3.87–5.11)
RDW: 14.3 % (ref 11.5–15.5)
WBC: 8.6 10*3/uL (ref 4.0–10.5)

## 2015-05-27 LAB — LIPID PANEL
CHOL/HDL RATIO: 5
Cholesterol: 236 mg/dL — ABNORMAL HIGH (ref 0–200)
HDL: 45.9 mg/dL (ref >39.00)
LDL Cholesterol: 163 mg/dL — ABNORMAL HIGH (ref 0–99)
NONHDL: 190.3
TRIGLYCERIDES: 139 mg/dL (ref 0.0–149.0)
VLDL: 27.8 mg/dL (ref 0.0–40.0)

## 2015-05-27 LAB — COMPREHENSIVE METABOLIC PANEL
ALBUMIN: 4.3 g/dL (ref 3.5–5.2)
ALK PHOS: 78 U/L (ref 39–117)
ALT: 16 U/L (ref 0–35)
AST: 17 U/L (ref 0–37)
BILIRUBIN TOTAL: 0.4 mg/dL (ref 0.2–1.2)
BUN: 17 mg/dL (ref 6–23)
CO2: 27 mEq/L (ref 19–32)
Calcium: 9.4 mg/dL (ref 8.4–10.5)
Chloride: 104 mEq/L (ref 96–112)
Creatinine, Ser: 0.71 mg/dL (ref 0.40–1.20)
GFR: 106.21 mL/min (ref >60.00)
Glucose, Bld: 89 mg/dL (ref 70–99)
POTASSIUM: 3.8 meq/L (ref 3.5–5.1)
Sodium: 140 mEq/L (ref 135–145)
TOTAL PROTEIN: 7.5 g/dL (ref 6.0–8.3)

## 2015-05-27 LAB — TSH: TSH: 0.89 u[IU]/mL (ref 0.35–4.50)

## 2015-05-27 LAB — HIV ANTIBODY (ROUTINE TESTING W REFLEX): HIV: NONREACTIVE

## 2015-05-27 LAB — HEPATITIS C ANTIBODY: HCV AB: NEGATIVE

## 2015-05-28 LAB — URINE CULTURE
Colony Count: NO GROWTH
Organism ID, Bacteria: NO GROWTH

## 2015-05-29 LAB — CYTOLOGY - PAP

## 2015-06-03 ENCOUNTER — Other Ambulatory Visit: Payer: 59

## 2015-06-03 ENCOUNTER — Ambulatory Visit: Payer: 59

## 2015-06-03 ENCOUNTER — Ambulatory Visit: Payer: Medicare Other

## 2015-06-24 ENCOUNTER — Other Ambulatory Visit: Payer: Medicare Other

## 2015-06-24 ENCOUNTER — Ambulatory Visit: Payer: Medicare Other

## 2015-07-16 ENCOUNTER — Inpatient Hospital Stay: Admission: RE | Admit: 2015-07-16 | Payer: Medicare Other | Source: Ambulatory Visit

## 2015-07-16 ENCOUNTER — Ambulatory Visit: Payer: Medicare Other

## 2015-11-25 ENCOUNTER — Telehealth: Payer: Self-pay | Admitting: Family Medicine

## 2015-11-25 NOTE — Telephone Encounter (Signed)
Pt states she had her flu shot in Nov/2016 [facility]

## 2015-11-26 NOTE — Telephone Encounter (Signed)
Chart Updated.    KP

## 2016-04-15 ENCOUNTER — Telehealth: Payer: Self-pay | Admitting: Family Medicine

## 2016-04-15 NOTE — Telephone Encounter (Signed)
Pt dropped off Health Examination Certificate form for Dr. Etter Sjogren to fill out, pt states she will pick up when ready, documents placed in tray at front office

## 2016-04-18 NOTE — Telephone Encounter (Signed)
We can fill out but date from last year is only date we can put on it

## 2016-04-18 NOTE — Telephone Encounter (Signed)
Noted.  Form filled out based on last year's information and forwarded to PCP.  Pt notified and made aware.  She was okay with that.  Scheduled TB skin test for tomorrow (04/19/16) at 2:15 pm.

## 2016-04-18 NOTE — Telephone Encounter (Signed)
LMOM informing Pt that form is completed and at front desk for pick up at her convenience. Copy sent for scanning.

## 2016-04-18 NOTE — Telephone Encounter (Signed)
Form received.  Pt was last seen on 05/26/15.  Pt will need an appt before form can be completed.  Pt stated understanding, but states she needs form completed by the end of this week.  She plans to keep appt as scheduled on 06/24/16.    Please advise.

## 2016-04-19 ENCOUNTER — Ambulatory Visit (INDEPENDENT_AMBULATORY_CARE_PROVIDER_SITE_OTHER): Payer: Medicare Other | Admitting: Behavioral Health

## 2016-04-19 DIAGNOSIS — Z23 Encounter for immunization: Secondary | ICD-10-CM

## 2016-04-19 NOTE — Progress Notes (Signed)
Pre visit review using our clinic review tool, if applicable. No additional management support is needed unless otherwise documented below in the visit note.  Patient in office today for PPD placement. Intradermal given in Left Arm. Patient tolerated injection well.

## 2016-04-21 LAB — TB SKIN TEST
Induration: 0 mm
TB Skin Test: NEGATIVE

## 2016-05-27 ENCOUNTER — Encounter: Payer: Medicare Other | Admitting: Family Medicine

## 2016-06-24 ENCOUNTER — Encounter: Payer: Medicare Other | Admitting: Family Medicine

## 2017-02-19 ENCOUNTER — Other Ambulatory Visit: Payer: Self-pay | Admitting: Pediatrics

## 2017-09-16 ENCOUNTER — Emergency Department (HOSPITAL_COMMUNITY): Payer: Medicare Other

## 2017-09-16 ENCOUNTER — Encounter (HOSPITAL_COMMUNITY): Payer: Self-pay | Admitting: *Deleted

## 2017-09-16 ENCOUNTER — Emergency Department (HOSPITAL_COMMUNITY)
Admission: EM | Admit: 2017-09-16 | Discharge: 2017-09-16 | Disposition: A | Discharge status: Home / Self Care | Payer: Medicare Other | Attending: Emergency Medicine | Admitting: Emergency Medicine

## 2017-09-16 DIAGNOSIS — R0602 Shortness of breath: Secondary | ICD-10-CM | POA: Diagnosis present

## 2017-09-16 DIAGNOSIS — Z79899 Other long term (current) drug therapy: Secondary | ICD-10-CM | POA: Diagnosis not present

## 2017-09-16 DIAGNOSIS — I10 Essential (primary) hypertension: Secondary | ICD-10-CM | POA: Diagnosis not present

## 2017-09-16 DIAGNOSIS — J45901 Unspecified asthma with (acute) exacerbation: Secondary | ICD-10-CM | POA: Diagnosis not present

## 2017-09-16 MED ORDER — PREDNISONE 20 MG PO TABS: 40.0000 mg | ORAL_TABLET | Freq: Every day | ORAL | 0 refills | Status: DC

## 2017-09-16 MED ORDER — ALBUTEROL SULFATE (2.5 MG/3ML) 0.083% IN NEBU
2.5000 mg | INHALATION_SOLUTION | Freq: Once | RESPIRATORY_TRACT | Status: AC
  Administered 2017-09-16: 2.5 mg via RESPIRATORY_TRACT
  Filled 2017-09-16: qty 3

## 2017-09-16 MED ORDER — ALBUTEROL SULFATE HFA 108 (90 BASE) MCG/ACT IN AERS: 2.0000 | INHALATION_SPRAY | RESPIRATORY_TRACT | 0 refills | Status: DC | PRN

## 2017-09-16 MED ORDER — PREDNISONE 20 MG PO TABS
60.0000 mg | ORAL_TABLET | Freq: Once | ORAL | Status: AC
  Administered 2017-09-16: 60 mg via ORAL
  Filled 2017-09-16: qty 3

## 2017-09-16 MED ORDER — IPRATROPIUM-ALBUTEROL 0.5-2.5 (3) MG/3ML IN SOLN
3.0000 mL | Freq: Once | RESPIRATORY_TRACT | Status: AC
  Administered 2017-09-16: 3 mL via RESPIRATORY_TRACT
  Filled 2017-09-16: qty 3

## 2017-09-16 MED ORDER — ALBUTEROL SULFATE (2.5 MG/3ML) 0.083% IN NEBU: 2.5000 mg | INHALATION_SOLUTION | RESPIRATORY_TRACT | 0 refills | Status: DC | PRN

## 2017-09-16 NOTE — Discharge Instructions (Signed)
Take the prescriptions as directed.  Use your albuterol inhaler (2 to 4 puffs) or your albuterol nebulizer (1 unit dose) every 4 hours for the next 7 days, then as needed for cough, wheezing, or shortness of breath.  Call your regular medical doctor Monday morning to schedule a follow up appointment within the next 3 days.  Return to the Emergency Department immediately sooner if worsening.

## 2017-09-16 NOTE — ED Provider Notes (Signed)
[facility] DEPT Provider Note   CSN: [id number] Arrival date & time: 09/16/17  1247     History   Chief Complaint Chief Complaint  Patient presents with  . Shortness of Breath    HPI Diane Shaw is a 66 y.o. female.  HPI  Pt was seen at 1320.  Per pt, c/o gradual onset and worsening of persistent cough, wheezing and SOB for the past 4 days.  Describes her symptoms as "my asthma is acting up."  Has been using home MDI and nebs with transient relief.  Denies CP/palpitations, no back pain, no abd pain, no N/V/D, no fevers, no rash.    Past Medical History:  Diagnosis Date  . Asthma   . HTN (hypertension)     Patient Active Problem List   Diagnosis Date Noted  . Asthma 08/05/2012  . Asthma exacerbation 01/27/2012  . Cervical sprain 01/27/2011  . Hyperlipidemia 10/14/2010  . BLOOD IN STOOL 04/02/2010  . POSTMENOPAUSAL STATUS 04/02/2010  . OBESITY 02/19/2010  . Asthma exacerbation, allergic 02/19/2010  . SHOULDER PAIN, LEFT 02/19/2010    Past Surgical History:  Procedure Laterality Date  . NO PAST SURGERIES      OB History    Gravida Para Term Preterm AB Living   5 5 5     5    SAB TAB Ectopic Multiple Live Births                   Home Medications    Prior to Admission medications   Medication Sig Start Date End Date Taking? Authorizing Provider  ADVAIR DISKUS 250-50 MCG/DOSE AEPB Inhale 1 puff into the lungs 2 (two) times daily. 05/26/15   Ann Held, DO  albuterol (PROAIR HFA) 108 (90 BASE) MCG/ACT inhaler inhale 2 puffs by mouth every 6 hours if needed for shortness of breath 05/26/15   Carollee Herter, Kendrick Fries R, DO  albuterol (PROVENTIL) (2.5 MG/3ML) 0.083% nebulizer solution inhale contents of 1 vial in nebulizer every 6 hours if needed for shortness of breath 04/15/13   Carollee Herter, Yvonne R, DO  amLODipine (NORVASC) 5 MG tablet Take 1 tablet (5 mg total) by mouth daily. 05/26/15   Ann Held, DO  Misc  Natural Products (OSTEO BI-FLEX JOINT SHIELD PO) Take 1 tablet by mouth daily.    [provider]  montelukast (SINGULAIR) 10 MG tablet Take 1 tablet (10 mg total) by mouth at bedtime. 05/26/15   Ann Held, DO  Multiple Vitamins-Minerals (MULTIVITAMINS THER. W/MINERALS) TABS Take 1 tablet by mouth daily.      [provider]  Probiotic Product ([place]) CAPS Take 1 capsule by mouth daily.    [provider]    Family History Family History  Problem Relation Age of Onset  . Heart failure Sister   . Diabetes Sister   . Heart disease Sister        chf  . Diabetes Mother   . Hypertension Mother   . Heart disease Mother        chf  . Heart disease Father 14       MI  . Hypertension Father   . Heart disease Brother   . Arthritis Sister   . Heart disease Brother        Cabg    Social History Social History   Tobacco Use  . Smoking status: Never Smoker  . Smokeless tobacco: Never Used  Substance Use Topics  .  Alcohol use: Yes    Alcohol/week: 0.6 - 1.2 oz    Types: 1 - 2 Glasses of wine per week  . Drug use: No     Allergies   Patient has no known allergies.   Review of Systems Review of Systems ROS: Statement: All systems negative except as marked or noted in the HPI; Constitutional: Negative for fever and chills. ; ; Eyes: Negative for eye pain, redness and discharge. ; ; ENMT: Negative for ear pain, hoarseness, nasal congestion, sinus pressure and sore throat. ; ; Cardiovascular: Negative for chest pain, palpitations, diaphoresis, dyspnea and peripheral edema. ; ; Respiratory: +cough, wheezing, SOB. Negative for stridor. ; ; Gastrointestinal: Negative for nausea, vomiting, diarrhea, abdominal pain, blood in stool, hematemesis, jaundice and rectal bleeding. . ; ; Genitourinary: Negative for dysuria, flank pain and hematuria. ; ; Musculoskeletal: Negative for back pain and neck pain. Negative for swelling and trauma.; ; Skin:  Negative for pruritus, rash, abrasions, blisters, bruising and skin lesion.; ; Neuro: Negative for headache, lightheadedness and neck stiffness. Negative for weakness, altered level of consciousness, altered mental status, extremity weakness, paresthesias, involuntary movement, seizure and syncope.       Physical Exam Updated Vital Signs BP (!) 176/87 (BP Location: Left Arm)   Pulse 92   Temp 98.3 F (36.8 C) (Oral)   Resp 20   Ht 5\' 5"  (1.651 m)   Wt 97.5 kg (215 lb)   SpO2 93%   BMI 35.78 kg/m   Physical Exam 1325: Physical examination:  Nursing notes reviewed; Vital signs and O2 SAT reviewed;  Constitutional: Well developed, Well nourished, Well hydrated, In no acute distress; Head:  Normocephalic, atraumatic; Eyes: EOMI, PERRL, No scleral icterus; ENMT: Mouth and pharynx normal, Mucous membranes moist; Neck: Supple, Full range of motion, No lymphadenopathy; Cardiovascular: Regular rate and rhythm, No gallop; Respiratory: Breath sounds coarse & equal bilaterally, exp wheezes bilat. No audible wheezing. +moist cough during exam. Speaking full sentences with ease, Normal respiratory effort/excursion; Chest: Nontender, Movement normal; Abdomen: Soft, Nontender, Nondistended, Normal bowel sounds; Genitourinary: No CVA tenderness; Extremities: Pulses normal, No tenderness, No edema, No calf edema or asymmetry.; Neuro: AA&Ox3, Major CN grossly intact.  Speech clear. No gross focal motor or sensory deficits in extremities.; Skin: Color normal, Warm, Dry.   ED Treatments / Results  Labs (all labs ordered are listed, but only abnormal results are displayed)   EKG  EKG Interpretation None       Radiology   Procedures Procedures (including critical care time)  Medications Ordered in ED Medications  predniSONE (DELTASONE) tablet 60 mg (60 mg Oral Given 09/16/17 1336)  ipratropium-albuterol (DUONEB) 0.5-2.5 (3) MG/3ML nebulizer solution 3 mL (3 mLs Nebulization Given 09/16/17 1336)    albuterol (PROVENTIL) (2.5 MG/3ML) 0.083% nebulizer solution 2.5 mg (2.5 mg Nebulization Given 09/16/17 1336)     Initial Impression / Assessment and Plan / ED Course  I have reviewed the triage vital signs and the nursing notes.  Pertinent labs & imaging results that were available during my care of the patient were reviewed by me and considered in my medical decision making (see chart for details).  MDM Reviewed: previous chart, nursing note and vitals Interpretation: x-ray   Dg Chest 2 View Result Date: 09/16/2017 CLINICAL DATA:  Productive cough. EXAM: CHEST  2 VIEW COMPARISON:  Radiograph of Jan 26, 2012. FINDINGS: The heart size and mediastinal contours are within normal limits. Both lungs are clear. No pneumothorax or pleural effusion is noted. Atherosclerosis  of thoracic aorta is noted. The visualized skeletal structures are unremarkable. IMPRESSION: No active cardiopulmonary disease.  Aortic atherosclerosis. Electronically Signed   By: Marijo Conception, M.D.   On: 09/16/2017 13:36     1445:  Pt states she "feels better" after neb x2 and steroid.  NAD, lungs CTA bilat, no wheezing, resps easy, speaking full sentences, Sats 100% R/A.  Pt ambulated around the ED with Sats remaining 98-100 % R/A, resps easy, NAD.  Wants to go home now. Tx symptomatically at this time. Dx and testing d/w pt.  Questions answered.  Verb understanding, agreeable to d/c home with outpt f/u.        Final Clinical Impressions(s) / ED Diagnoses   Final diagnoses:  None    ED Discharge Orders    None       Francine Graven, DO 09/19/17 0805

## 2017-09-16 NOTE — ED Triage Notes (Signed)
Pt complains of shortness of breath upon exertion, cough and wheezing x 4 days. Pt has hx of asthma, states she has been using her inhaler, which she states provides some relief.

## 2017-09-16 NOTE — ED Notes (Signed)
Patient transported to X-ray 

## 2017-09-16 NOTE — ED Notes (Signed)
Pt ambulated around the department without assistance. O2 saturation went from 97% to 93% pt complaining of slight shortness of breath but stated she feels a lot better walking this time then last time.

## 2017-09-16 NOTE — ED Notes (Signed)
Patient ambulated down hall maintaining oxygen saturation 98-100%. Patient reports feeling much better after breathing treatment.

## 2018-08-17 ENCOUNTER — Emergency Department (HOSPITAL_COMMUNITY)
Admission: EM | Admit: 2018-08-17 | Discharge: 2018-08-17 | Disposition: A | Discharge status: Home / Self Care | Payer: Medicare Other | Attending: Emergency Medicine | Admitting: Emergency Medicine

## 2018-08-17 ENCOUNTER — Emergency Department (HOSPITAL_COMMUNITY): Payer: Medicare Other

## 2018-08-17 ENCOUNTER — Other Ambulatory Visit: Payer: Self-pay

## 2018-08-17 ENCOUNTER — Encounter (HOSPITAL_COMMUNITY): Payer: Self-pay

## 2018-08-17 DIAGNOSIS — R0602 Shortness of breath: Secondary | ICD-10-CM | POA: Diagnosis present

## 2018-08-17 DIAGNOSIS — J45901 Unspecified asthma with (acute) exacerbation: Secondary | ICD-10-CM

## 2018-08-17 DIAGNOSIS — Z79899 Other long term (current) drug therapy: Secondary | ICD-10-CM | POA: Diagnosis not present

## 2018-08-17 MED ORDER — PREDNISONE 20 MG PO TABS: 40.0000 mg | ORAL_TABLET | Freq: Every day | ORAL | 0 refills | Status: DC

## 2018-08-17 MED ORDER — ALBUTEROL (5 MG/ML) CONTINUOUS INHALATION SOLN
10.0000 mg/h | INHALATION_SOLUTION | RESPIRATORY_TRACT | Status: DC
  Administered 2018-08-17: 10 mg/h via RESPIRATORY_TRACT
  Filled 2018-08-17: qty 20

## 2018-08-17 NOTE — ED Provider Notes (Signed)
[facility] DEPT Provider Note   CSN: [id number] Arrival date & time: 08/17/18  0944     History   Chief Complaint Chief Complaint  Patient presents with  . Shortness of Breath    HPI Diane Shaw is a 67 y.o. female.  HPI   67 year old female with dyspnea.  She began feeling short of breath yesterday while at her mother's funeral.  She has a past history of asthma and feels like she may be having exacerbation.  She has been wheezing.  She has been doing nebs at home with some mild transient improvement.  She received 10 mg of albuterol, 1 mg of Atrovent, 2 g of magnesium 125 mg of Solu-Medrol by EMS prior to arrival.  She reports that she is feeling better but not back to her baseline.  Fevers or chills.  Occasional nonproductive cough.  She denies any chest pain.  No swelling or orthopnea.  Past Medical History:  Diagnosis Date  . Asthma   . HTN (hypertension)     Patient Active Problem List   Diagnosis Date Noted  . Asthma 08/05/2012  . Asthma exacerbation 01/27/2012  . Cervical sprain 01/27/2011  . Hyperlipidemia 10/14/2010  . BLOOD IN STOOL 04/02/2010  . POSTMENOPAUSAL STATUS 04/02/2010  . OBESITY 02/19/2010  . Asthma exacerbation, allergic 02/19/2010  . SHOULDER PAIN, LEFT 02/19/2010    Past Surgical History:  Procedure Laterality Date  . NO PAST SURGERIES       OB History    Gravida  5   Para  5   Term  5   Preterm      AB      Living  5     SAB      TAB      Ectopic      Multiple      Live Births               Home Medications    Prior to Admission medications   Medication Sig Start Date End Date Taking? Authorizing Provider  ADVAIR DISKUS 250-50 MCG/DOSE AEPB Inhale 1 puff into the lungs 2 (two) times daily. 05/26/15   Ann Held, DO  albuterol (PROAIR HFA) 108 (90 BASE) MCG/ACT inhaler inhale 2 puffs by mouth every 6 hours if needed for shortness of breath 05/26/15   Carollee Herter, Kendrick Fries R, DO  albuterol (PROVENTIL HFA;VENTOLIN HFA) 108 (90 Base) MCG/ACT inhaler Inhale 2 puffs into the lungs every 4 (four) hours as needed for wheezing or shortness of breath. 09/16/17   Francine Graven, DO  albuterol (PROVENTIL) (2.5 MG/3ML) 0.083% nebulizer solution inhale contents of 1 vial in nebulizer every 6 hours if needed for shortness of breath 04/15/13   Carollee Herter, Yvonne R, DO  albuterol (PROVENTIL) (2.5 MG/3ML) 0.083% nebulizer solution Take 3 mLs (2.5 mg total) by nebulization every 4 (four) hours as needed for wheezing or shortness of breath. 09/16/17   Francine Graven, DO  amLODipine (NORVASC) 5 MG tablet Take 1 tablet (5 mg total) by mouth daily. 05/26/15   Ann Held, DO  Misc Natural Products (OSTEO BI-FLEX JOINT SHIELD PO) Take 1 tablet by mouth daily.    [provider]  montelukast (SINGULAIR) 10 MG tablet Take 1 tablet (10 mg total) by mouth at bedtime. 05/26/15   Ann Held, DO  Multiple Vitamins-Minerals (MULTIVITAMINS THER. W/MINERALS) TABS Take 1 tablet by mouth daily.      [provider]  predniSONE (DELTASONE) 20 MG tablet Take 2 tablets (40 mg total) by mouth daily. 09/16/17   Francine Graven, DO  Probiotic Product (Limon) CAPS Take 1 capsule by mouth daily.    [provider]    Family History Family History  Problem Relation Age of Onset  . Heart failure Sister   . Diabetes Sister   . Heart disease Sister        chf  . Diabetes Mother   . Hypertension Mother   . Heart disease Mother        chf  . Heart disease Father 61       MI  . Hypertension Father   . Heart disease Brother   . Arthritis Sister   . Heart disease Brother        Cabg    Social History Social History   Tobacco Use  . Smoking status: Never Smoker  . Smokeless tobacco: Never Used  Substance Use Topics  . Alcohol use: Yes    Alcohol/week: 1.0 - 2.0 standard drinks    Types: 1 - 2 Glasses of wine per  week  . Drug use: No     Allergies   Patient has no known allergies.   Review of Systems Review of Systems  All systems reviewed and negative, other than as noted in HPI.  Physical Exam Updated Vital Signs BP (!) 171/80 (BP Location: Right Arm)   Pulse (!) 110   Temp 97.9 F (36.6 C) (Oral)   Resp (!) 21   Ht 5\' 5"  (1.651 m)   Wt 99.8 kg   SpO2 100%   BMI 36.61 kg/m   Physical Exam  Constitutional: She appears well-developed and well-nourished. No distress.  HENT:  Head: Normocephalic and atraumatic.  Eyes: Conjunctivae are normal. Right eye exhibits no discharge. Left eye exhibits no discharge.  Neck: Neck supple.  Cardiovascular: Regular rhythm and normal heart sounds. Exam reveals no gallop and no friction rub.  No murmur heard. Mild tachycardia  Pulmonary/Chest: Effort normal. No respiratory distress. She has wheezes.  Abdominal: Soft. She exhibits no distension. There is no tenderness.  Musculoskeletal: She exhibits no edema or tenderness.  Lower extremities symmetric as compared to each other. No calf tenderness. Negative Homan's. No palpable cords.   Neurological: She is alert.  Skin: Skin is warm and dry.  Psychiatric: She has a normal mood and affect. Her behavior is normal. Thought content normal.  Nursing note and vitals reviewed.    ED Treatments / Results  Labs (all labs ordered are listed, but only abnormal results are displayed) Labs Reviewed - No data to display  EKG EKG Interpretation  Date/Time:  Friday August 17 2018 10:03:16 EST Ventricular Rate:  109 PR Interval:    QRS Duration: 94 QT Interval:  331 QTC Calculation: 446 R Axis:   70 Text Interpretation:  Sinus tachycardia Ventricular premature complex Borderline T abnormalities, diffuse leads Confirmed by Virgel Manifold [phone]) on 08/17/2018 10:10:43 AM   Radiology No results found.  Procedures Procedures (including critical care time)  Medications Ordered in  ED Medications  albuterol (PROVENTIL,VENTOLIN) solution continuous neb (10 mg/hr Nebulization New Bag/Given 08/17/18 1026)     Initial Impression / Assessment and Plan / ED Course  I have reviewed the triage vital signs and the nursing notes.  Pertinent labs & imaging results that were available during my care of the patient were reviewed by me and considered in my medical decision making (see chart for details).  67 year old female with dyspnea.  Improved after nebs and steroids.  Oxygen saturations are 100% room air.  She is only mildly tachypneic.  I feel she is appropriate for further outpatient treatment.  Return precautions discussed.  Final Clinical Impressions(s) / ED Diagnoses   Final diagnoses:  Exacerbation of asthma, unspecified asthma severity, unspecified whether persistent    ED Discharge Orders         Ordered    predniSONE (DELTASONE) 20 MG tablet  Daily     08/17/18 1146           Virgel Manifold, MD 08/27/18 1553

## 2018-08-17 NOTE — Discharge Instructions (Signed)
Your chest x-ray is clear. Take steroids as prescribed. Continue to use albuterol as needed.

## 2018-08-17 NOTE — ED Triage Notes (Signed)
Pt BIBA from home. Pt has been [facility] since yesterday at her mother's funeral. Pt received solu-medrol and breathing treatments on scene, without improvement. Pt was ambulatory on scene, but declined with walking. Pt received 125 solu medrol, 2 g mag, 10 mg albuterol, 1mg  atrovent with EMS, as well as 5 mg albuterol of her own prior to EMS arrival. Pt A&Ox4.

## 2018-08-17 NOTE — ED Notes (Signed)
Respiratory called for neb.

## 2018-08-17 NOTE — ED Notes (Signed)
Bed: WA06 Expected date:  Expected time:  Means of arrival:  Comments: EMS asthma

## 2019-04-26 ENCOUNTER — Other Ambulatory Visit: Payer: Self-pay | Admitting: Family Medicine

## 2019-04-26 DIAGNOSIS — Z1231 Encounter for screening mammogram for malignant neoplasm of breast: Secondary | ICD-10-CM

## 2019-04-26 DIAGNOSIS — Z780 Asymptomatic menopausal state: Secondary | ICD-10-CM

## 2019-05-14 ENCOUNTER — Other Ambulatory Visit: Payer: Self-pay | Admitting: Family Medicine

## 2019-05-14 DIAGNOSIS — Z13820 Encounter for screening for osteoporosis: Secondary | ICD-10-CM

## 2019-05-14 DIAGNOSIS — N6489 Other specified disorders of breast: Secondary | ICD-10-CM

## 2019-07-26 ENCOUNTER — Other Ambulatory Visit: Payer: Medicare Other

## 2019-10-11 ENCOUNTER — Ambulatory Visit
Admission: RE | Admit: 2019-10-11 | Discharge: 2019-10-11 | Disposition: A | Discharge status: Home / Self Care | Payer: Medicare Other | Source: Ambulatory Visit | Attending: Family Medicine | Admitting: Family Medicine

## 2019-10-11 ENCOUNTER — Other Ambulatory Visit: Payer: Self-pay

## 2019-10-11 DIAGNOSIS — Z13820 Encounter for screening for osteoporosis: Secondary | ICD-10-CM

## 2020-01-21 ENCOUNTER — Other Ambulatory Visit: Payer: Self-pay

## 2020-01-21 ENCOUNTER — Ambulatory Visit
Admission: RE | Admit: 2020-01-21 | Discharge: 2020-01-21 | Disposition: A | Discharge status: Home / Self Care | Payer: Medicare Other | Source: Ambulatory Visit | Attending: Family Medicine | Admitting: Family Medicine

## 2020-01-21 DIAGNOSIS — N6489 Other specified disorders of breast: Secondary | ICD-10-CM

## 2020-08-31 IMAGING — MG DIGITAL DIAGNOSTIC BILAT W/ TOMO W/ CAD
8 series · 8 of 24 positions shown · non-contrast
Comparison: Previous exam(s).

CLINICAL DATA: 68-year-old for interval follow-up of likely benign
asymmetries in the LEFT breast identified on a baseline mammogram in
7400.She attended the first six-month follow-up in 2242, but returns
now for completion. Annual evaluation, RIGHT breast.

EXAM:
DIGITAL DIAGNOSTIC BILATERAL MAMMOGRAM WITH CAD AND TOMO

[R MLO synth-2D]
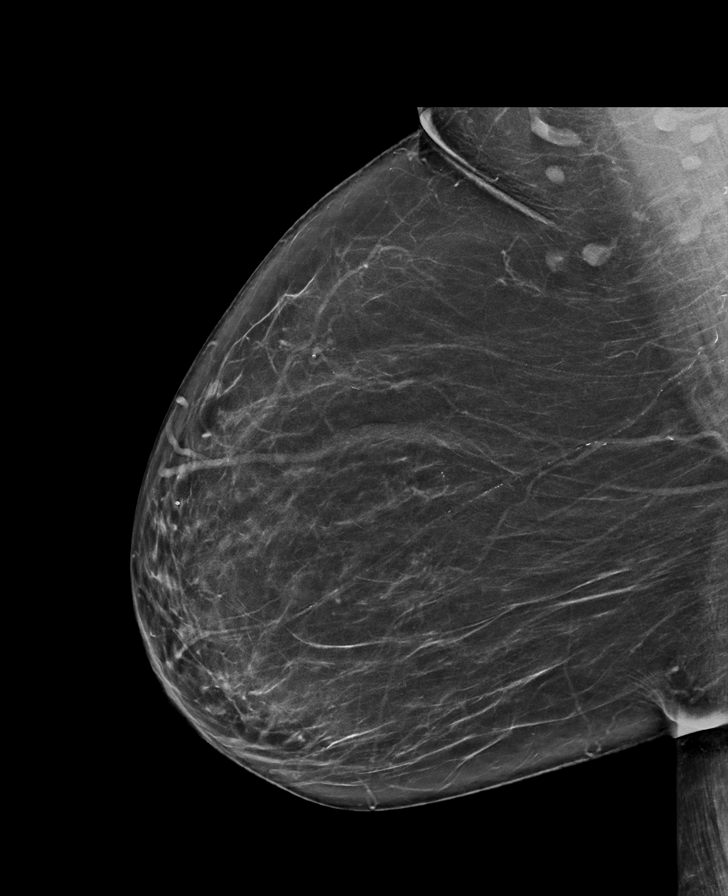

[L MLO synth-2D]
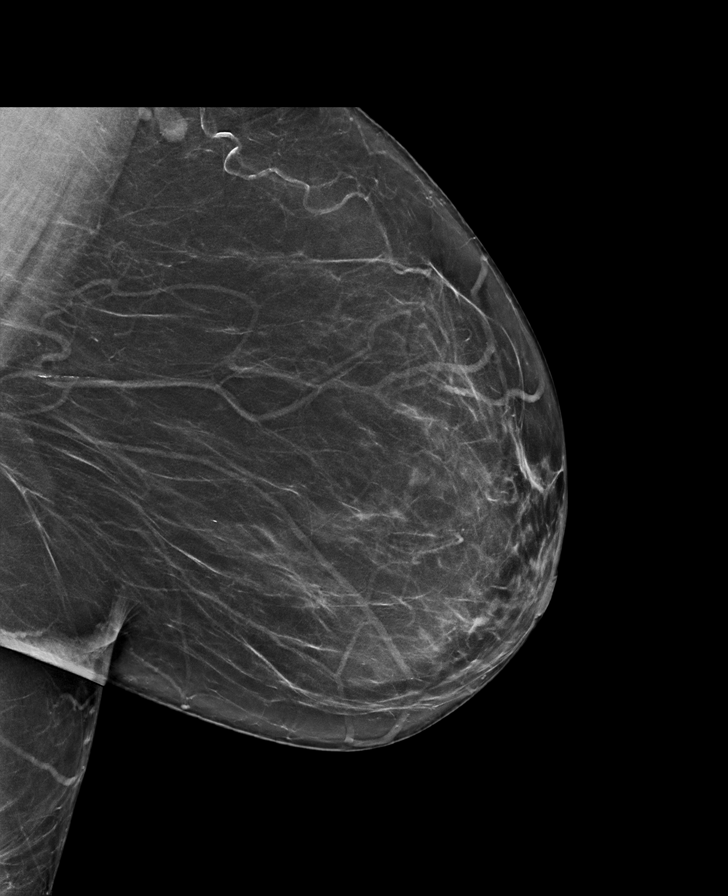

[L CC synth-2D]
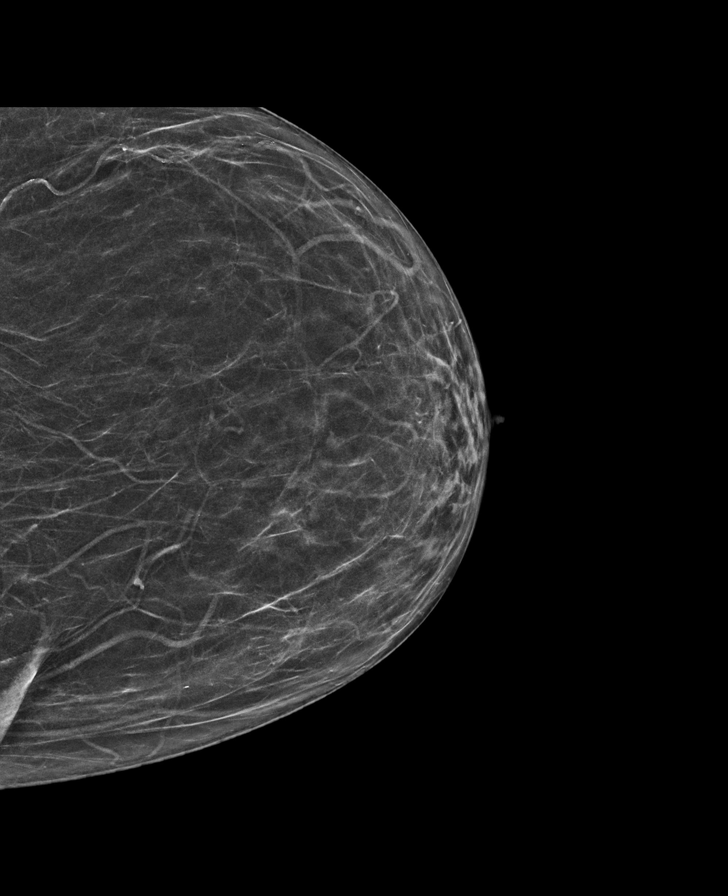

[R CC synth-2D]
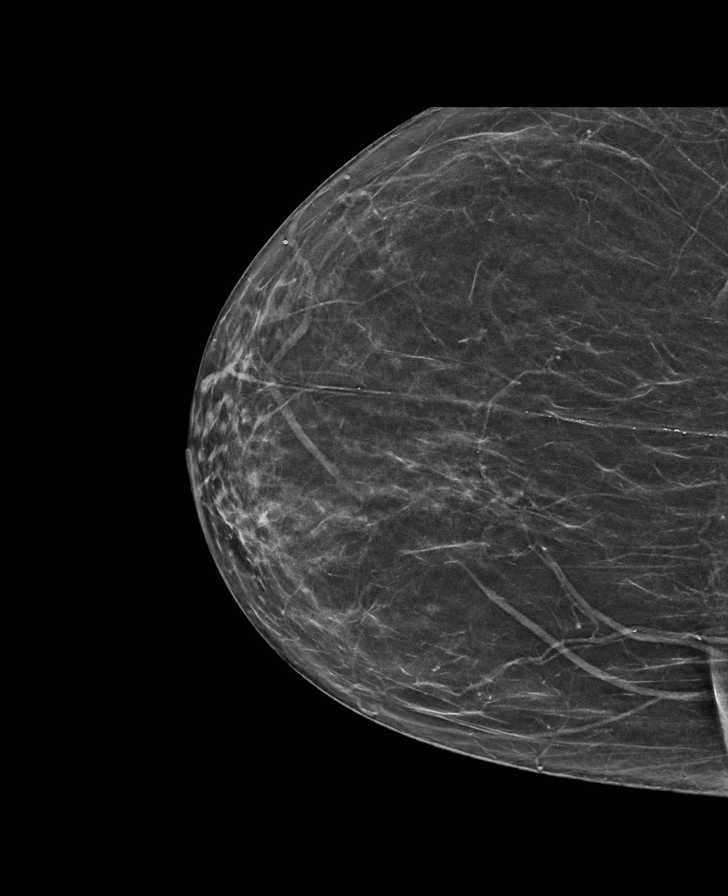

[R CC tomo · tomo slice 31/61.0]
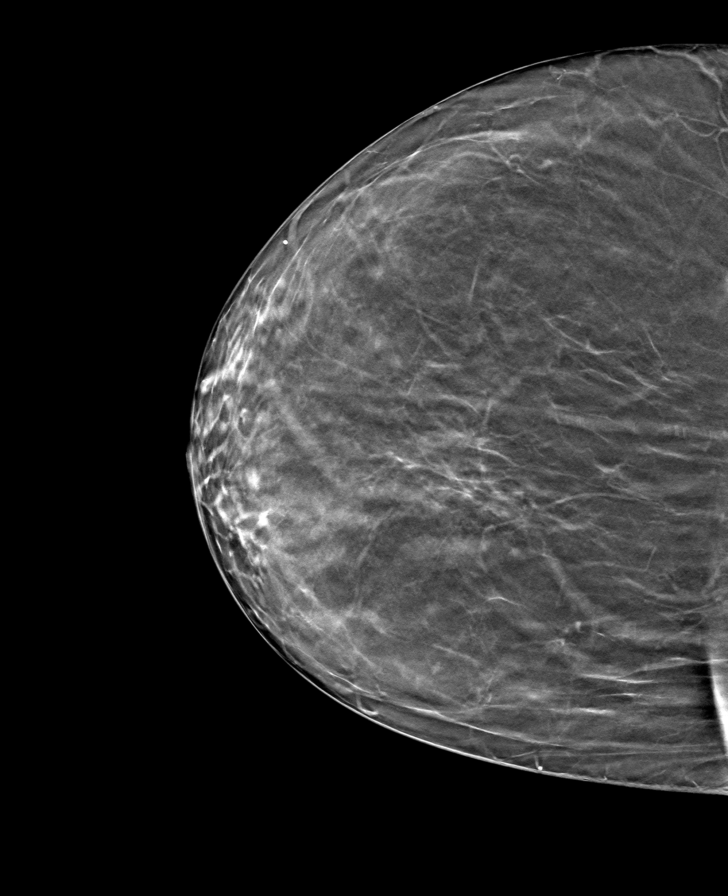

[L MLO tomo · tomo slice 39/76.0]
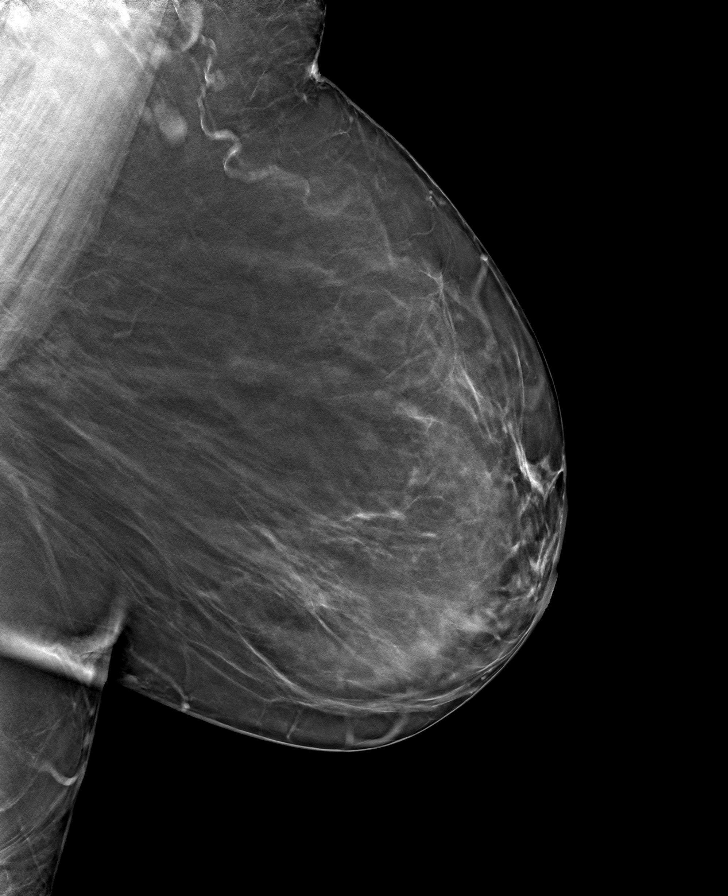

[L CC tomo · tomo slice 33/64.0]
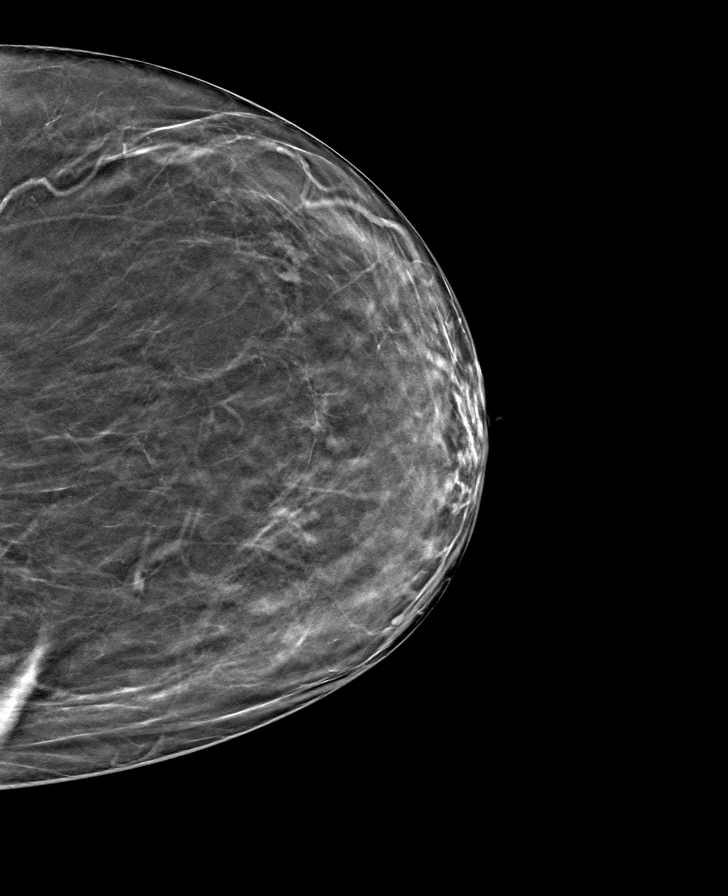

[R MLO tomo · tomo slice 39/78.0]
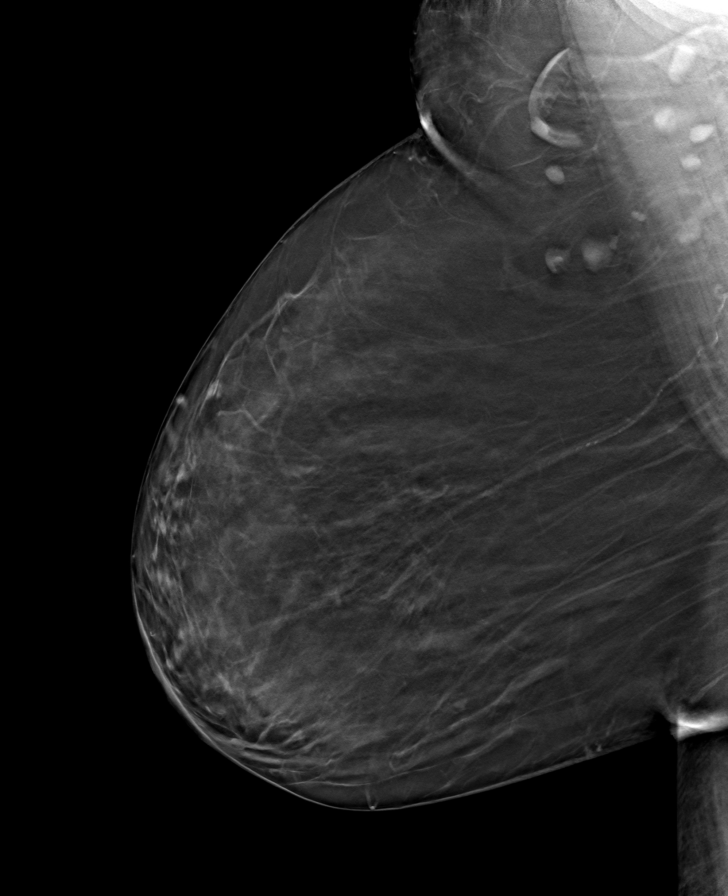

[8 of 24 positions shown; findings below may reference images not displayed]

ACR Breast Density Category b: There are scattered areas of
fibroglandular density.
FINDINGS: Tomosynthesis and synthesized full field CC and MLO views of both
breasts were obtained.

The asymmetries in the LEFT breast questioned on the prior
mammograms are shown on the tomosynthesis images to represent
scattered areas of normal fibroglandular tissue.

No findings suspicious for malignancy in either breast.

Mammographic images were processed with CAD.
IMPRESSION: No mammographic evidence of malignancy involving either breast.

RECOMMENDATION:
Screening mammogram in one year.(Code:I8-F-E2V)

I have discussed the findings and recommendations with the patient.
If applicable, a reminder letter will be sent to the patient
regarding the next appointment.

BI-RADS CATEGORY  1: Negative.

## 2020-10-20 ENCOUNTER — Encounter: Payer: Self-pay | Admitting: Gastroenterology

## 2020-10-30 ENCOUNTER — Ambulatory Visit (INDEPENDENT_AMBULATORY_CARE_PROVIDER_SITE_OTHER): Payer: Medicare Other | Admitting: Pulmonary Disease

## 2020-10-30 ENCOUNTER — Other Ambulatory Visit: Payer: Self-pay

## 2020-10-30 ENCOUNTER — Encounter: Payer: Self-pay | Admitting: Pulmonary Disease

## 2020-10-30 VITALS — BP 138/82 | HR 88 | Temp 97.2°F | Ht 65.0 in | Wt 227.0 lb

## 2020-10-30 DIAGNOSIS — J4550 Severe persistent asthma, uncomplicated: Secondary | ICD-10-CM | POA: Diagnosis not present

## 2020-10-30 MED ORDER — MONTELUKAST SODIUM 10 MG PO TABS: 10.0000 mg | ORAL_TABLET | Freq: Every day | ORAL | 3 refills | Status: AC

## 2020-10-30 MED ORDER — TRELEGY ELLIPTA 100-62.5-25 MCG/INH IN AEPB: 1.0000 | INHALATION_SPRAY | Freq: Every day | RESPIRATORY_TRACT | 0 refills | Status: DC

## 2020-10-30 MED ORDER — ALBUTEROL SULFATE HFA 108 (90 BASE) MCG/ACT IN AERS: 2.0000 | INHALATION_SPRAY | RESPIRATORY_TRACT | 3 refills | Status: DC | PRN

## 2020-10-30 MED ORDER — TRELEGY ELLIPTA 100-62.5-25 MCG/INH IN AEPB: 1.0000 | INHALATION_SPRAY | Freq: Every day | RESPIRATORY_TRACT | 3 refills | Status: DC

## 2020-10-30 MED ORDER — ALBUTEROL SULFATE HFA 108 (90 BASE) MCG/ACT IN AERS: 2.0000 | INHALATION_SPRAY | RESPIRATORY_TRACT | 3 refills | Status: AC | PRN

## 2020-10-30 NOTE — Patient Instructions (Signed)
Start trelegy ellipta inhaler 1 puff daily - rinse mouth out after each use  Continue albuterol 1-2 puffs every 4-6 hours as needed for cough, shortness of breath, wheezing or chest tightness  Continue singulair 10mg  daily

## 2020-10-30 NOTE — Addendum Note (Signed)
Addended by: Coralie Keens on: 10/30/2020 04:43 PM   Modules accepted: Orders

## 2020-10-30 NOTE — Progress Notes (Signed)
Synopsis: Referred in January 2022 for Asthma  Subjective:   PATIENT ID: Diane Shaw GENDER: female DOB: Oct 22, 1950, MRN: [id number]   HPI  Chief Complaint  Patient presents with  . Consult    Asthma, more SOB and congestion   Diane Shaw is a 70 year old woman, never smoker with history of asthma who is referred to pulmonary clinic for evaluation.   She has been on advair diskus 250-4mcg and singulair for her asthma regimen. She has been without these medications over the past year. She has been using albuterol inhaler 3-4 times per day and nebulizer treatments 1-2 times per night due to awakenings for cough/congestion/dyspnea.   Summer time is her hardest season due to the heat. She last took prednisone in December for her breathing. The last CBC on file was in 2016 and showed an absolute eosinophil count of 300/uL.   Pulmonary function testing in 2013 showed moderate obstruction after bronchodilator therapy with a 42% increase. She had mild diffusion defect at that time. Normal TLC.   CTA chest in 2013 was concerning for tracheobronchomalacia.    Past Medical History:  Diagnosis Date  . Asthma   . HTN (hypertension)      Family History  Problem Relation Age of Onset  . Heart failure Sister   . Diabetes Sister   . Heart disease Sister        chf  . Diabetes Mother   . Hypertension Mother   . Heart disease Mother        chf  . Heart disease Father 33       MI  . Hypertension Father   . Heart disease Brother   . Arthritis Sister   . Heart disease Brother        Cabg     Social History   Socioeconomic History  . Marital status: Single    Spouse name: Not on file  . Number of children: Not on file  . Years of education: Not on file  . Highest education level: Not on file  Occupational History  . Not on file  Tobacco Use  . Smoking status: Never Smoker  . Smokeless tobacco: Never Used  Substance and Sexual Activity  . Alcohol use: Yes     Alcohol/week: 1.0 - 2.0 standard drink    Types: 1 - 2 Glasses of wine per week  . Drug use: No  . Sexual activity: Yes    Partners: Male    Birth control/protection: None  Other Topics Concern  . Not on file  Social History Narrative  . Not on file   Social Determinants of Health   Financial Resource Strain: Not on file  Food Insecurity: Not on file  Transportation Needs: Not on file  Physical Activity: Not on file  Stress: Not on file  Social Connections: Not on file  Intimate Partner Violence: Not on file     No Known Allergies   Outpatient Medications Prior to Visit  Medication Sig Dispense Refill  . albuterol (PROVENTIL) (2.5 MG/3ML) 0.083% nebulizer solution Take 3 mLs (2.5 mg total) by nebulization every 4 (four) hours as needed for wheezing or shortness of breath. 75 mL 0  . Misc Natural Products (OSTEO BI-FLEX JOINT SHIELD PO) Take 1 tablet by mouth daily.    . Multiple Vitamins-Minerals (MULTIVITAMINS THER. W/MINERALS) TABS Take 1 tablet by mouth daily.    . naproxen sodium (ALEVE) 220 MG tablet Take 440 mg by mouth as needed (headache/pain).    Marland Kitchen  OVER THE COUNTER MEDICATION Apply 1 application topically as needed (pain). OTC CBD cream    . albuterol (PROAIR HFA) 108 (90 BASE) MCG/ACT inhaler inhale 2 puffs by mouth every 6 hours if needed for shortness of breath 8.5 g 5  . albuterol (PROVENTIL HFA;VENTOLIN HFA) 108 (90 Base) MCG/ACT inhaler Inhale 2 puffs into the lungs every 4 (four) hours as needed for wheezing or shortness of breath. 1 Inhaler 0  . albuterol (PROVENTIL) (2.5 MG/3ML) 0.083% nebulizer solution inhale contents of 1 vial in nebulizer every 6 hours if needed for shortness of breath 225 mL 3  . montelukast (SINGULAIR) 10 MG tablet Take 1 tablet (10 mg total) by mouth at bedtime. 90 tablet 3  . predniSONE (DELTASONE) 20 MG tablet Take 2 tablets (40 mg total) by mouth daily. 8 tablet 0  . ADVAIR DISKUS 250-50 MCG/DOSE AEPB Inhale 1 puff into the lungs 2  (two) times daily. (Patient not taking: Reported on 10/30/2020) 60 each 3   No facility-administered medications prior to visit.    Review of Systems  Constitutional: Negative for chills, fever, malaise/fatigue and weight loss.  HENT: Negative for congestion, sinus pain and sore throat.   Eyes: Negative.   Respiratory: Positive for cough, shortness of breath and wheezing. Negative for hemoptysis and sputum production.   Cardiovascular: Negative for chest pain, palpitations, orthopnea, claudication and leg swelling.  Gastrointestinal: Positive for heartburn (occasional). Negative for abdominal pain, nausea and vomiting.  Genitourinary: Negative.   Musculoskeletal: Negative for joint pain and myalgias.  Skin: Negative for rash.  Neurological: Negative for weakness.  Endo/Heme/Allergies: Negative.   Psychiatric/Behavioral: Negative.     Objective:   Vitals:   10/30/20 1059  BP: 138/82  Pulse: 88  Temp: (!) 97.2 F (36.2 C)  TempSrc: Oral  SpO2: 98%  Weight: 227 lb (103 kg)  Height: 5\' 5"  (1.651 m)     Physical Exam Constitutional:      General: She is not in acute distress.    Appearance: She is obese. She is not ill-appearing.  HENT:     Head: Normocephalic and atraumatic.  Eyes:     General: No scleral icterus.    Conjunctiva/sclera: Conjunctivae normal.     Pupils: Pupils are equal, round, and reactive to light.  Cardiovascular:     Rate and Rhythm: Normal rate and regular rhythm.     Pulses: Normal pulses.     Heart sounds: Normal heart sounds. No murmur heard.   Pulmonary:     Effort: Pulmonary effort is normal.     Breath sounds: Normal breath sounds. No wheezing, rhonchi or rales.  Abdominal:     General: Bowel sounds are normal.     Palpations: Abdomen is soft.  Musculoskeletal:     Right lower leg: No edema.     Left lower leg: No edema.  Lymphadenopathy:     Cervical: No cervical adenopathy.  Skin:    General: Skin is warm and dry.  Neurological:      General: No focal deficit present.     Mental Status: She is alert.  Psychiatric:        Mood and Affect: Mood normal.        Behavior: Behavior normal.        Thought Content: Thought content normal.        Judgment: Judgment normal.    CBC    Component Value Date/Time   WBC 8.6 05/26/2015 1511   RBC 4.32 05/26/2015 1511  HGB 11.8 (L) 05/26/2015 1511   HCT 36.0 05/26/2015 1511   PLT 279.0 05/26/2015 1511   MCV 83.4 05/26/2015 1511   MCH 27.2 01/30/2012 0455   MCHC 32.8 05/26/2015 1511   RDW 14.3 05/26/2015 1511   LYMPHSABS 2.1 05/26/2015 1511   MONOABS 0.4 05/26/2015 1511   EOSABS 0.3 05/26/2015 1511   BASOSABS 0.0 05/26/2015 1511    Chest imaging: CXR 08/17/2018 The heart size and mediastinal contours are within normal limits. Both lungs are clear. No pneumothorax or pleural effusion is noted. The visualized skeletal structures are unremarkable.  CTA Chest 2013 1. Study is exceedingly limited by extensive patient respiratory  motion for assessment of pulmonary embolism. No large central  pulmonary embolus identified.  2. Findings, as above, consistent with tracheobronchial malacia.  PFT: No flowsheet data found. 07/2012 FEV1/FVC 53 FEV1 62% (1.49L), 42% improvement post bronchodilator FVC 86% (2.79L) TLC 88% ( 4.63L) RV 125% (2.52L) DLCO 63%  Assessment & Plan:   Severe persistent asthma without complication  Discussion: Diane Shaw is a 71 year old woman, never smoker with history of asthma who is referred to pulmonary clinic for evaluation.   She has severe persistent asthma based on her clinical history and bronchodilator responsiveness on pulmonary function tests in the past. She is to start trelegy ellipta 1 puff daily and use as needed albuterol inhaler or nebs. She is to continue taking montelukast. At the next visit if her symptoms are not as well controlled we will check a CBC with differential and IgE level.  She does have history of  GERD but this appears to be under control at this time. We can consider adding PPI therapy in the future if her breathing symptoms remain uncontrolled.   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 We will continue to monitor her symptoms for concern of this disease process.  Follow up in 3 months  Freda Jackson, MD [facility] Office: [phone]   Current Outpatient Medications:  .  albuterol (PROVENTIL) (2.5 MG/3ML) 0.083% nebulizer solution, Take 3 mLs (2.5 mg total) by nebulization every 4 (four) hours as needed for wheezing or shortness of breath., Disp: 75 mL, Rfl: 0 .  Fluticasone-Umeclidin-Vilant (TRELEGY ELLIPTA) 100-62.5-25 MCG/INH AEPB, Inhale 1 puff into the lungs daily., Disp: 3 each, Rfl: 3 .  Misc Natural Products (OSTEO BI-FLEX JOINT SHIELD PO), Take 1 tablet by mouth daily., Disp: , Rfl:  .  Multiple Vitamins-Minerals (MULTIVITAMINS THER. W/MINERALS) TABS, Take 1 tablet by mouth daily., Disp: , Rfl:  .  naproxen sodium (ALEVE) 220 MG tablet, Take 440 mg by mouth as needed (headache/pain)., Disp: , Rfl:  .  OVER THE COUNTER MEDICATION, Apply 1 application topically as needed (pain). OTC CBD cream, Disp: , Rfl:  .  albuterol (VENTOLIN HFA) 108 (90 Base) MCG/ACT inhaler, Inhale 2 puffs into the lungs every 4 (four) hours as needed for wheezing or shortness of breath., Disp: 3 each, Rfl: 3 .  montelukast (SINGULAIR) 10 MG tablet, Take 1 tablet (10 mg total) by mouth at bedtime., Disp: 90 tablet, Rfl: 3

## 2020-12-04 ENCOUNTER — Ambulatory Visit (AMBULATORY_SURGERY_CENTER): Payer: Self-pay

## 2020-12-04 ENCOUNTER — Other Ambulatory Visit: Payer: Self-pay

## 2020-12-04 ENCOUNTER — Other Ambulatory Visit: Payer: Medicare Other

## 2020-12-04 VITALS — Ht 65.0 in | Wt 229.0 lb

## 2020-12-04 DIAGNOSIS — Z1211 Encounter for screening for malignant neoplasm of colon: Secondary | ICD-10-CM

## 2020-12-04 DIAGNOSIS — R195 Other fecal abnormalities: Secondary | ICD-10-CM

## 2020-12-04 MED ORDER — PLENVU 140 G PO SOLR: 1.0000 | ORAL | 0 refills | Status: DC

## 2020-12-04 NOTE — Progress Notes (Signed)
No egg or soy allergy known to patient  No issues with past sedation with any surgeries or procedures No intubation problems in the past  No FH of Malignant Hyperthermia No diet pills per patient No home 02 use per patient  No blood thinners per patient  Pt denies issues with constipation  No A fib or A flutter  EMMI video via [place] 19 guidelines implemented in PV today with Pt and RN  Coupon given to pt in PV today, Code to Pharmacy and  NO PA's for preps discussed with pt in PV today  Discussed with pt there will be an out-of-pocket cost for prep and that varies from $0 to 70 dollars  Due to the COVID-19 pandemic we are asking patients to follow certain guidelines.   Pt aware of COVID protocols and [facility] guidelines  Patient denies loose or missing teeth, dentures, partials, dental implants, capped or bonded teeth;

## 2020-12-18 ENCOUNTER — Other Ambulatory Visit: Payer: Self-pay

## 2020-12-18 ENCOUNTER — Encounter: Payer: Self-pay | Admitting: Gastroenterology

## 2020-12-18 ENCOUNTER — Ambulatory Visit (AMBULATORY_SURGERY_CENTER): Payer: Medicare Other | Admitting: Gastroenterology

## 2020-12-18 VITALS — BP 150/85 | HR 79 | Temp 97.4°F | Resp 26 | Ht 65.0 in | Wt 229.0 lb

## 2020-12-18 DIAGNOSIS — D122 Benign neoplasm of ascending colon: Secondary | ICD-10-CM

## 2020-12-18 DIAGNOSIS — R195 Other fecal abnormalities: Secondary | ICD-10-CM

## 2020-12-18 DIAGNOSIS — K5730 Diverticulosis of large intestine without perforation or abscess without bleeding: Secondary | ICD-10-CM | POA: Diagnosis not present

## 2020-12-18 DIAGNOSIS — K640 First degree hemorrhoids: Secondary | ICD-10-CM

## 2020-12-18 DIAGNOSIS — D123 Benign neoplasm of transverse colon: Secondary | ICD-10-CM

## 2020-12-18 DIAGNOSIS — Z1211 Encounter for screening for malignant neoplasm of colon: Secondary | ICD-10-CM

## 2020-12-18 MED ORDER — SODIUM CHLORIDE 0.9 % IV SOLN: 500.0000 mL | Freq: Once | INTRAVENOUS | Status: DC

## 2020-12-18 NOTE — Op Note (Signed)
[facility] Patient Name: Diane Shaw Procedure Date: 12/18/2020 11:29 AM MRN: [id number] Endoscopist: Ladene Artist , MD Age: 70 Referring MD:  Date of Birth: Jul 01, 1951 Gender: Female Account #: [id number] Procedure:                Colonoscopy Indications:              Positive Cologuard test Medicines:                Monitored Anesthesia Care Procedure:                Pre-Anesthesia Assessment:                           - Prior to the procedure, a History and Physical                            was performed, and patient medications and                            allergies were reviewed. The patient's tolerance of                            previous anesthesia was also reviewed. The risks                            and benefits of the procedure and the sedation                            options and risks were discussed with the patient.                            All questions were answered, and informed consent                            was obtained. Prior Anticoagulants: The patient has                            taken no previous anticoagulant or antiplatelet                            agents. ASA Grade Assessment: II - A patient with                            mild systemic disease. After reviewing the risks                            and benefits, the patient was deemed in                            satisfactory condition to undergo the procedure.                           After obtaining informed consent, the colonoscope  was passed under direct vision. Throughout the                            procedure, the patient's blood pressure, pulse, and                            oxygen saturations were monitored continuously. The                            Olympus CF-HQ190L [id number]) Colonoscope was                            introduced through the anus and advanced to the the                            cecum, identified by appendiceal  orifice and                            ileocecal valve. The ileocecal valve, appendiceal                            orifice, and rectum were photographed. The quality                            of the bowel preparation was good. The colonoscopy                            was performed without difficulty. The patient                            tolerated the procedure well. Scope In: 11:36:31 AM Scope Out: 11:48:34 AM Scope Withdrawal Time: 0 hours 10 minutes 20 seconds  Total Procedure Duration: 0 hours 12 minutes 3 seconds  Findings:                 The perianal and digital rectal examinations were                            normal.                           Two sessile polyps were found in the transverse                            colon and ascending colon. The polyps were 6 to 7                            mm in size. These polyps were removed with a cold                            snare. Resection and retrieval were complete.                           Scattered small-mouthed diverticula were found in  the right colon. There was no evidence of                            diverticular bleeding.                           Multiple medium-mouthed diverticula were found in                            the left colon. There was no evidence of                            diverticular bleeding.                           Internal hemorrhoids were found during                            retroflexion. The hemorrhoids were moderate and                            Grade I (internal hemorrhoids that do not prolapse).                           The exam was otherwise without abnormality on                            direct and retroflexion views. Complications:            No immediate complications. Estimated blood loss:                            None. Estimated Blood Loss:     Estimated blood loss: none. Impression:               - Two 6 to 7 mm polyps in the transverse colon  and                            in the ascending colon, removed with a cold snare.                            Resected and retrieved.                           - Mild diverticulosis in the right colon.                           - Moderate diverticulosis in the left colon.                           - Internal hemorrhoids.                           - The examination was otherwise normal on direct  and retroflexion views. Recommendation:           - Repeat colonoscopy date to be determined after                            pending pathology results are reviewed for                            surveillance.                           - Patient has a contact number available for                            emergencies. The signs and symptoms of potential                            delayed complications were discussed with the                            patient. Return to normal activities tomorrow.                            Written discharge instructions were provided to the                            patient.                           - High fiber diet.                           - Continue present medications.                           - Await pathology results. Ladene Artist, MD 12/18/2020 11:54:47 AM This report has been signed electronically.

## 2020-12-18 NOTE — Patient Instructions (Addendum)
Handouts were given to your care partner on polyps, diverticulosis, hemorrhoids and a high fiber diet with liberal fluid intake. You may resume your current medications today. Await biopsy results.  May take 1-3 weeks to receive pathology results. Please call if any questions or concerns.     YOU HAD AN ENDOSCOPIC PROCEDURE TODAY AT [facility]:   Refer to the procedure report that was given to you for any specific questions about what was found during the examination.  If the procedure report does not answer your questions, please call your gastroenterologist to clarify.  If you requested that your care partner not be given the details of your procedure findings, then the procedure report has been included in a sealed envelope for you to review at your convenience later.  YOU SHOULD EXPECT: Some feelings of bloating in the abdomen. Passage of more gas than usual.  Walking can help get rid of the air that was put into your GI tract during the procedure and reduce the bloating. If you had a lower endoscopy (such as a colonoscopy or flexible sigmoidoscopy) you may notice spotting of blood in your stool or on the toilet paper. If you underwent a bowel prep for your procedure, you may not have a normal bowel movement for a few days.  Please Note:  You might notice some irritation and congestion in your nose or some drainage.  This is from the oxygen used during your procedure.  There is no need for concern and it should clear up in a day or so.  SYMPTOMS TO REPORT IMMEDIATELY:   Following lower endoscopy (colonoscopy or flexible sigmoidoscopy):  Excessive amounts of blood in the stool  Significant tenderness or worsening of abdominal pains  Swelling of the abdomen that is new, acute  Fever of 100F or higher  For urgent or emergent issues, a gastroenterologist can be reached at any hour by calling [phone]. Do not use MyChart messaging for urgent concerns.    DIET:  We  do recommend a small meal at first, but then you may proceed to your regular diet.  Drink plenty of fluids but you should avoid alcoholic beverages for 24 hours.  ACTIVITY:  You should plan to take it easy for the rest of today and you should NOT DRIVE or use heavy machinery until tomorrow (because of the sedation medicines used during the test).    FOLLOW UP: Our staff will call the number listed on your records 48-72 hours following your procedure to check on you and address any questions or concerns that you may have regarding the information given to you following your procedure. If we do not reach you, we will leave a message.  We will attempt to reach you two times.  During this call, we will ask if you have developed any symptoms of COVID 19. If you develop any symptoms (ie: fever, flu-like symptoms, shortness of breath, cough etc.) before then, please call [phone].  If you test positive for Covid 19 in the 2 weeks post procedure, please call and report this information to [place].    If any biopsies were taken you will be contacted by phone or by letter within the next 1-3 weeks.  Please call us at [phone] if you have not heard about the biopsies in 3 weeks.    SIGNATURES/CONFIDENTIALITY: You and/or your care partner have signed paperwork which will be entered into your electronic medical record.  These signatures attest to the fact that  that the information above on your After Visit Summary has been reviewed and is understood.  Full responsibility of the confidentiality of this discharge information lies with you and/or your care-partner.

## 2020-12-18 NOTE — Progress Notes (Signed)
Report to PACU, RN, vss, BBS= Clear.  

## 2020-12-18 NOTE — Progress Notes (Signed)
Called to room to assist during endoscopic procedure.  Patient ID and intended procedure confirmed with present staff. Received instructions for my participation in the procedure from the performing physician.  

## 2020-12-18 NOTE — Progress Notes (Signed)
No problems noted in the recovery room. maw 

## 2020-12-18 NOTE — Progress Notes (Signed)
Medical history reviewed with no changes noted. VS assessed by C.W 

## 2020-12-22 ENCOUNTER — Telehealth: Payer: Self-pay

## 2020-12-22 NOTE — Telephone Encounter (Signed)
  Follow up Call-  Call back number 12/18/2020  Post procedure Call Back phone  # [phone]  Permission to leave phone message Yes  Some recent data might be hidden     Patient questions:  Do you have a fever, pain , or abdominal swelling? No. Pain Score  0 *  Have you tolerated food without any problems? Yes.    Have you been able to return to your normal activities? Yes.    Do you have any questions about your discharge instructions: Diet   No. Medications  No. Follow up visit  No.  Do you have questions or concerns about your Care? No.  Actions: * If pain score is 4 or above: No action needed, pain <4.  1. Have you developed a fever since your procedure? no  2.   Have you had an respiratory symptoms (SOB or cough) since your procedure? no  3.   Have you tested positive for COVID 19 since your procedure no  4.   Have you had any family members/close contacts diagnosed with the COVID 19 since your procedure?  no   If yes to any of these questions please route to Joylene John, RN and Joella Prince, RN

## 2020-12-30 ENCOUNTER — Encounter: Payer: Self-pay | Admitting: Gastroenterology

## 2021-12-29 ENCOUNTER — Other Ambulatory Visit: Payer: Self-pay | Admitting: Pulmonary Disease

## 2022-01-27 ENCOUNTER — Other Ambulatory Visit: Payer: Self-pay | Admitting: Pulmonary Disease

## 2022-10-17 ENCOUNTER — Other Ambulatory Visit: Payer: Self-pay | Admitting: Student

## 2022-10-17 ENCOUNTER — Encounter: Payer: Self-pay | Admitting: Student

## 2022-10-17 DIAGNOSIS — I739 Peripheral vascular disease, unspecified: Secondary | ICD-10-CM

## 2022-10-17 DIAGNOSIS — J4550 Severe persistent asthma, uncomplicated: Secondary | ICD-10-CM

## 2022-10-18 ENCOUNTER — Ambulatory Visit
Admission: RE | Admit: 2022-10-18 | Discharge: 2022-10-18 | Disposition: A | Discharge status: Home / Self Care | Payer: Medicare HMO | Source: Ambulatory Visit | Attending: Student | Admitting: Student

## 2022-10-18 ENCOUNTER — Other Ambulatory Visit: Payer: Medicare Other

## 2022-10-18 ENCOUNTER — Other Ambulatory Visit: Payer: Self-pay | Admitting: Student

## 2022-10-18 DIAGNOSIS — I739 Peripheral vascular disease, unspecified: Secondary | ICD-10-CM

## 2022-10-18 DIAGNOSIS — J4550 Severe persistent asthma, uncomplicated: Secondary | ICD-10-CM

## 2022-10-20 ENCOUNTER — Other Ambulatory Visit: Payer: Self-pay | Admitting: Student

## 2022-10-20 DIAGNOSIS — I739 Peripheral vascular disease, unspecified: Secondary | ICD-10-CM

## 2022-10-25 ENCOUNTER — Inpatient Hospital Stay: Admission: RE | Admit: 2022-10-25 | Payer: Medicare HMO | Source: Ambulatory Visit

## 2022-11-03 ENCOUNTER — Ambulatory Visit
Admission: RE | Admit: 2022-11-03 | Discharge: 2022-11-03 | Disposition: A | Discharge status: Home / Self Care | Payer: Medicare HMO | Source: Ambulatory Visit | Attending: Student | Admitting: Student

## 2022-11-03 DIAGNOSIS — I739 Peripheral vascular disease, unspecified: Secondary | ICD-10-CM

## 2022-11-09 ENCOUNTER — Encounter: Payer: Self-pay | Admitting: Student

## 2022-11-10 ENCOUNTER — Ambulatory Visit
Admission: RE | Admit: 2022-11-10 | Discharge: 2022-11-10 | Disposition: A | Discharge status: Home / Self Care | Payer: Medicare HMO | Source: Ambulatory Visit | Attending: Student | Admitting: Student

## 2022-11-10 DIAGNOSIS — J4550 Severe persistent asthma, uncomplicated: Secondary | ICD-10-CM

## 2022-11-10 MED ORDER — IOPAMIDOL (ISOVUE-300) INJECTION 61%
75.0000 mL | Freq: Once | INTRAVENOUS | Status: AC | PRN
  Administered 2022-11-10: 75 mL via INTRAVENOUS

## 2023-02-01 ENCOUNTER — Encounter: Payer: Self-pay | Admitting: Student

## 2023-11-01 ENCOUNTER — Ambulatory Visit
Admission: RE | Admit: 2023-11-01 | Discharge: 2023-11-01 | Disposition: A | Discharge status: Home / Self Care | Payer: Medicare HMO | Source: Ambulatory Visit | Attending: Student | Admitting: Student

## 2023-11-01 ENCOUNTER — Other Ambulatory Visit: Payer: Self-pay | Admitting: Student

## 2023-11-01 DIAGNOSIS — M25512 Pain in left shoulder: Secondary | ICD-10-CM

## 2023-11-22 ENCOUNTER — Encounter: Payer: Self-pay | Admitting: Orthopaedic Surgery

## 2023-11-22 ENCOUNTER — Ambulatory Visit (INDEPENDENT_AMBULATORY_CARE_PROVIDER_SITE_OTHER): Payer: Medicare HMO | Admitting: Orthopaedic Surgery

## 2023-11-22 DIAGNOSIS — M25512 Pain in left shoulder: Secondary | ICD-10-CM | POA: Diagnosis not present

## 2023-11-22 DIAGNOSIS — G8929 Other chronic pain: Secondary | ICD-10-CM

## 2023-11-22 MED ORDER — METHYLPREDNISOLONE ACETATE 40 MG/ML IJ SUSP
40.0000 mg | INTRAMUSCULAR | Status: AC | PRN
  Administered 2023-11-22: 40 mg via INTRA_ARTICULAR

## 2023-11-22 MED ORDER — LIDOCAINE HCL 1 % IJ SOLN
3.0000 mL | INTRAMUSCULAR | Status: AC | PRN
  Administered 2023-11-22: 3 mL

## 2023-11-22 MED ORDER — BUPIVACAINE HCL 0.5 % IJ SOLN
3.0000 mL | INTRAMUSCULAR | Status: AC | PRN
  Administered 2023-11-22: 3 mL via INTRA_ARTICULAR

## 2023-11-22 NOTE — Progress Notes (Signed)
 Office Visit Note   Patient: Diane Shaw           Date of Birth: August 12, 1951           MRN: [id number] Visit Date: 11/22/2023              Requested by: Hillery Aldo, NP [address] [place],  [place] [address] PCP: Hillery Aldo, NP   Assessment & Plan: Visit Diagnoses:  1. Chronic left shoulder pain     Plan: Diane Shaw is a 73 year old female with chronic left shoulder pain due to rotator cuff arthropathy and advanced DJD.  X-rays from PACS independently reviewed and interpreted shows advanced degenerative changes of the glenohumeral joint consistent with rotator cuff arthropathy.  Her shoulder function is very well compensated.  Her main issue is pain.  We did a glenohumeral injection today which she tolerated well.  Will see her back as needed.  Follow-Up Instructions: No follow-ups on file.   Orders:  Orders Placed This Encounter  Procedures   Large Joint Inj: L glenohumeral   No orders of the defined types were placed in this encounter.     Procedures: Large Joint Inj: L glenohumeral on 11/22/2023 10:27 AM Indications: pain Details: 22 G needle  Arthrogram: No  Medications: 3 mL lidocaine 1 %; 3 mL bupivacaine 0.5 %; 40 mg methylPREDNISolone acetate 40 MG/ML Outcome: tolerated well, no immediate complications Patient was prepped and draped in the usual sterile fashion.       Clinical Data: No additional findings.   Subjective: Chief Complaint  Patient presents with   Left Shoulder - Pain    HPI [name] is a very pleasant 73 year old female here for evaluation of chronic left shoulder pain for about 3 months.  She had a fall on her shoulder years ago and has had cortisone injections in the past.  Feels occasional numbness and tingling down the fingertips.  Feels weak and decreased range of motion.  Has rheumatoid arthritis.  Recently had x-rays at PCPs office. Review of Systems  Constitutional: Negative.   HENT: Negative.    Eyes: Negative.    Respiratory: Negative.    Cardiovascular: Negative.   Endocrine: Negative.   Musculoskeletal: Negative.   Neurological: Negative.   Hematological: Negative.   Psychiatric/Behavioral: Negative.    All other systems reviewed and are negative.    Objective: Vital Signs: There were no vitals taken for this visit.  Physical Exam Vitals and nursing note reviewed.  Constitutional:      Appearance: She is well-developed.  HENT:     Head: Atraumatic.     Nose: Nose normal.  Eyes:     Extraocular Movements: Extraocular movements intact.  Cardiovascular:     Pulses: Normal pulses.  Pulmonary:     Effort: Pulmonary effort is normal.  Abdominal:     Palpations: Abdomen is soft.  Musculoskeletal:     Cervical back: Neck supple.  Skin:    General: Skin is warm.     Capillary Refill: Capillary refill takes less than 2 seconds.  Neurological:     Mental Status: She is alert. Mental status is at baseline.  Psychiatric:        Behavior: Behavior normal.        Thought Content: Thought content normal.        Judgment: Judgment normal.     Ortho Exam Examination of the left shoulder shows active flexion to 165 degrees and active abduction to 95 degrees.  She has significant weakness  to the isolated manual muscle testing the supraspinatus and infraspinatus. Specialty Comments:  No specialty comments available.  Imaging: No results found.   PMFS History: Patient Active Problem List   Diagnosis Date Noted   Asthma 08/05/2012   Asthma exacerbation 01/27/2012   Cervical sprain 01/27/2011   Hyperlipidemia 10/14/2010   BLOOD IN STOOL 04/02/2010   POSTMENOPAUSAL STATUS 04/02/2010   OBESITY 02/19/2010   Asthma exacerbation, allergic 02/19/2010   SHOULDER PAIN, LEFT 02/19/2010   Past Medical History:  Diagnosis Date   Arthritis    RIGHT shoulder   Asthma    uses inhaler and nebulizer PRN   GERD (gastroesophageal reflux disease)    diet controlled-OTC PRN   HTN  (hypertension)    on meds   Hyperlipidemia    on meds   Sickle cell trait (HCC)    Vitamin D deficiency     Family History  Problem Relation Age of Onset   Heart failure Sister    Diabetes Sister    Heart disease Sister        chf   Diabetes Mother    Hypertension Mother    Heart disease Mother        chf   Heart disease Father 23       MI   Hypertension Father    Heart disease Brother    Arthritis Sister    Heart disease Brother        Cabg   Colon polyps Brother 74   Stomach cancer Son 47   Colon cancer Neg Hx    Esophageal cancer Neg Hx    Rectal cancer Neg Hx     Past Surgical History:  Procedure Laterality Date   TUBAL LIGATION     Social History   Occupational History   Not on file  Tobacco Use   Smoking status: Never   Smokeless tobacco: Never  Vaping Use   Vaping status: Never Used  Substance and Sexual Activity   Alcohol use: Yes    Alcohol/week: 4.0 standard drinks of alcohol    Types: 4 Standard drinks or equivalent per week   Drug use: No   Sexual activity: Yes    Partners: Male    Birth control/protection: None

## 2024-01-26 NOTE — Progress Notes (Addendum)
 Cardiology Office Note:   Date:  01/29/2024  ID:  Diane Shaw, DOB [birth year], MRN [id number] PCP:  Maryellen Snare, NP  [facility] Providers Cardiologist:  Alyssa Backbone, MD Referring MD: Maryellen Snare, NP  Chief Complaint/Reason for Referral: Dyspnea ASSESSMENT:    1. Shortness of breath   2. Coronary artery disease involving native coronary artery of native heart, unspecified whether angina present   3. Hyperlipidemia LDL goal <70   4. Aortic atherosclerosis (HCC)   5. Diastolic dysfunction   6. Stage 3a chronic kidney disease (HCC)   7. Palpitations   8. Primary hypertension   9. BMI 33.0-33.9,adult     PLAN:   In order of problems listed above: Dyspnea:  We will obtain a coronary CTA to evaluate further.  If the patient has mild obstructive coronary artery disease, they will require a statin (with goal LDL < 70) and aspirin , if they have high-grade disease we will need to consider optimal medical therapy and if symptoms are refractory to medical therapy, then a cardiac catheterization with possible PCI will be pursued to alleviate symptoms.  If they have high risk disease we will proceed directly to cardiac catheterization.  If cardiac evaluation is reassuring and trial of Lasix  is not helpful this likely represents a pulmonary etiology. CAD: Incidentally noted on chest CT.  Continue aspirin  81 mg, atorvastatin  40 mg. Hyperlipidemia: LDL was 78 in January.  Goal LDL-C is less than 70.   Aortic atherosclerosis: Continue aspirin  81 mg and atorvastatin .  Consider LP(a) testing in the future  Diastolic dysfunction: Will trial Lasix  20 mg daily and check a BMP next week.  If this is efficacious we will continue the medication.  Could consider ARB and SGLT2 inhibitor in the future.   CKD stage IIIa: Consider ARB and SGLT2 inhibitor in the future. Palpitations: Reassuring monitor; continue Toprol  25 mg daily. Hypertension: Continue amlodipine  10 mg daily, hydrochlorothiazide 12.5  mg a day Toprol  25 mg daily; blood pressure is well-controlled today.  Elevated BMI: Diet and exercise modification; check hemoglobin A1c to screen for diabetes.   ADDENDUM 02/20/24:  Patient had CTA done at [facility] 01/25/24 with the following results:  Coronary CTA from Jan 25, 2024 - Calcium  score of 480. This places the patient in the 67 percentile for age and race based on the [place] data base.  - Technically difficult study with motion artifacts and blooming artifacts.  - Normal trajectory of coronaries with right dominant system. - CAD RADS 3, P 3. - Moderately reduced LV systolic function with LVEF 39%. I would consider cardiac echo for better evaluation of LV systolic function, LVEF and LV wall motion abnormalities ( by recent echo LV EF 60-63% as above). - Moderate coronary artery disease with ostial small caliber (1.5-1.45mm caliber) first diagonal with about 50-65% stenosis. Otherwise mild nonobstructive multivessel CAD as above. Mid RCA contains calcified plaque with 25-49% stenosis.  - Study was sent for [facility] evaluation. 1. LAD has a low likelihood of lesion-specific ischemia with an FFRct value of 0.87.  The first diagonal ostial stenosis has a low likelihood of lesion-specific ischemia with an FFRct value of 0.96.  2. LCX has a low likelihood of lesion-specific ischemia with an FFRct value of of 0.9.  3. FFR analysis of Mid RCA 25-49% stenosis apparently couldn't be assessed due to significant CCTA artifact.  - Ascending aorta size at upper limit of normal measuring 33 mm x 33 mm. Extracardiac findings - Cholelithiasis. Stable, partially imaged left renal  simple cysts. No acute changes.   GIVEN ONGOING SYMPTOMS, SUBOPTIMAL CTA, AND ELEVATED CORONARY CALCIFICATION, WILL REFER FOR RHC + CORS RE: DYSPNEA.           Dispo:  Return in about 3 months (around 04/30/2024).      Medication Adjustments/Labs and Tests Ordered: Current medicines are reviewed at length with the patient today.   Concerns regarding medicines are outlined above.  The following changes have been made:     Labs/tests ordered: Orders Placed This Encounter  Procedures   CT CORONARY MORPH W/CTA COR W/SCORE W/CA W/CM &/OR WO/CM   Basic metabolic panel with GFR   Hemoglobin A1c   EKG 12-Lead    Medication Changes: Meds ordered this encounter  Medications   furosemide  (LASIX ) 20 MG tablet    Sig: Take 1 tablet (20 mg total) by mouth daily.    Dispense:  14 tablet    Refill:  0   metoprolol  tartrate (LOPRESSOR ) 100 MG tablet    Sig: Take 1 tablet (100 mg total) by mouth once for 1 dose. Take 90-120 minutes prior to scan.    Dispense:  1 tablet    Refill:  0    Current medicines are reviewed at length with the patient today.  The patient does not have concerns regarding medicines.     History of Present Illness:      FOCUSED PROBLEM LIST:   Asthma/COPD Diastolic dysfunction Mild LVH, EF 55 to 60% TTE 2025 OSH SVT 2 episodes; 4 beats monitor 2024 OSH VT 3 episodes; longest lasting 6 beats monitored 2024 OSH Hyperlipidemia Aortic atherosclerosis Chest CT 2024 CAD Chest CT 2024 CKD stage IIIa BMI 33  May 2025:  Patient consents to use of AI scribe. Patient is a 73 year old female with above listed medical problems referred for recommendations regarding dyspnea.  She experiences shortness of breath during normal activities such as walking short distances or performing household tasks, requiring frequent rest due to breathlessness. This issue has persisted for about a year. No shortness of breath while sitting or lying flat, and no chest pain. She sometimes wheezes when short of breath.  Her asthma medication was recently changed from Trelegy to Vertrix, taken daily. She uses her nebulizer at night and her emergency inhaler frequently, which provide some relief. She has a history of asthma and COPD, using a nebulizer and an emergency inhaler.  She reports a weight loss of 30 pounds,  initially hoping it would improve her breathing, but it has not led to significant changes. No swelling in her legs, blacking out spells, or palpitations. She does not have a staircase at home, limiting her exertion to walking short distances.  Her past medical history includes asthma, COPD, and a previous CT scan showing cholesterol deposits on the aorta. Extra heartbeats were noted from a monitor in 2024, but no follow-up was done at that time. She is currently on a baby aspirin .          Current Medications: Current Meds  Medication Sig   albuterol  (PROVENTIL ) (2.5 MG/3ML) 0.083% nebulizer solution Take 3 mLs (2.5 mg total) by nebulization every 4 (four) hours as needed for wheezing or shortness of breath.   albuterol  (VENTOLIN  HFA) 108 (90 Base) MCG/ACT inhaler Inhale 2 puffs into the lungs every 4 (four) hours as needed for wheezing or shortness of breath.   atorvastatin  (LIPITOR) 40 MG tablet Take 40 mg by mouth daily.   cyclobenzaprine  (FLEXERIL ) 5 MG tablet Take 5 mg by  mouth 3 (three) times daily as needed for muscle spasms.   Fluticasone -Umeclidin-Vilant (TRELEGY ELLIPTA ) 100-62.5-25 MCG/INH AEPB Inhale 1 puff into the lungs daily.   furosemide  (LASIX ) 20 MG tablet Take 1 tablet (20 mg total) by mouth daily.   metoprolol  tartrate (LOPRESSOR ) 100 MG tablet Take 1 tablet (100 mg total) by mouth once for 1 dose. Take 90-120 minutes prior to scan.   montelukast  (SINGULAIR ) 10 MG tablet Take 1 tablet (10 mg total) by mouth at bedtime.   Multiple Vitamins-Minerals (MULTIVITAMINS THER. W/MINERALS) TABS Take 1 tablet by mouth daily.   naproxen sodium (ALEVE) 220 MG tablet Take 440 mg by mouth daily.   Vitamin D, Ergocalciferol, (DRISDOL) 1.25 MG (50000 UNIT) CAPS capsule Take 50,000 Units by mouth once a week.     Review of Systems:   Please see the history of present illness.    All other systems reviewed and are negative.     EKGs/Labs/Other Test Reviewed:   EKG: 2019 sinus  tachycardia with PVCs  EKG Interpretation Date/Time:  Monday Jan 29 2024 11:23:09 EDT Ventricular Rate:  89 PR Interval:  162 QRS Duration:  78 QT Interval:  332 QTC Calculation: 403 R Axis:   77  Text Interpretation: Sinus rhythm with Premature supraventricular complexes Nonspecific T wave abnormality When compared with ECG of 17-Aug-2018 10:03, PREVIOUS ECG IS PRESENT Confirmed by Alyssa Backbone ([id number]) on 01/29/2024 11:24:36 AM         Risk Assessment/Calculations:          Physical Exam:   VS:  BP 126/64   Pulse 89   Ht 5\' 5"  (1.651 m)   Wt 201 lb (91.2 kg)   SpO2 97%   BMI 33.45 kg/m        Wt Readings from Last 3 Encounters:  01/29/24 201 lb (91.2 kg)  12/18/20 229 lb (103.9 kg)  12/04/20 229 lb (103.9 kg)      GENERAL:  No apparent distress, AOx3 HEENT:  No carotid bruits, +2 carotid impulses, no scleral icterus CAR: RRR no murmurs, gallops, rubs, or thrills RES:  Clear to auscultation bilaterally ABD:  Soft, nontender, nondistended, positive bowel sounds x 4 VASC:  +2 radial pulses, +2 carotid pulses NEURO:  CN 2-12 grossly intact; motor and sensory grossly intact PSYCH:  No active depression or anxiety EXT:  No edema, ecchymosis, or cyanosis  Signed, Damyra Luscher K Mabelle Mungin, MD  01/29/2024 12:07 PM    [facility] [address] [place], [place], [place]  [address] Phone: [phone]; Fax: [phone]   Note:  This document was prepared using Dragon voice recognition software and may include unintentional dictation errors.

## 2024-01-26 NOTE — H&P (View-Only) (Signed)
 Cardiology Office Note:   Date:  01/29/2024  ID:  Diane Shaw, DOB [birth year], MRN [id number] PCP:  Maryellen Snare, NP  [facility] Providers Cardiologist:  Alyssa Backbone, MD Referring MD: Maryellen Snare, NP  Chief Complaint/Reason for Referral: Dyspnea ASSESSMENT:    1. Shortness of breath   2. Coronary artery disease involving native coronary artery of native heart, unspecified whether angina present   3. Hyperlipidemia LDL goal <70   4. Aortic atherosclerosis (HCC)   5. Diastolic dysfunction   6. Stage 3a chronic kidney disease (HCC)   7. Palpitations   8. Primary hypertension   9. BMI 33.0-33.9,adult     PLAN:   In order of problems listed above: Dyspnea:  We will obtain a coronary CTA to evaluate further.  If the patient has mild obstructive coronary artery disease, they will require a statin (with goal LDL < 70) and aspirin , if they have high-grade disease we will need to consider optimal medical therapy and if symptoms are refractory to medical therapy, then a cardiac catheterization with possible PCI will be pursued to alleviate symptoms.  If they have high risk disease we will proceed directly to cardiac catheterization.  If cardiac evaluation is reassuring and trial of Lasix  is not helpful this likely represents a pulmonary etiology. CAD: Incidentally noted on chest CT.  Continue aspirin  81 mg, atorvastatin  40 mg. Hyperlipidemia: LDL was 78 in January.  Goal LDL-C is less than 70.   Aortic atherosclerosis: Continue aspirin  81 mg and atorvastatin .  Consider LP(a) testing in the future  Diastolic dysfunction: Will trial Lasix  20 mg daily and check a BMP next week.  If this is efficacious we will continue the medication.  Could consider ARB and SGLT2 inhibitor in the future.   CKD stage IIIa: Consider ARB and SGLT2 inhibitor in the future. Palpitations: Reassuring monitor; continue Toprol  25 mg daily. Hypertension: Continue amlodipine  10 mg daily, hydrochlorothiazide 12.5  mg a day Toprol  25 mg daily; blood pressure is well-controlled today.  Elevated BMI: Diet and exercise modification; check hemoglobin A1c to screen for diabetes.   ADDENDUM 02/20/24:  Patient had CTA done at [facility] 01/25/24 with the following results:  Coronary CTA from Jan 25, 2024 - Calcium  score of 480. This places the patient in the 67 percentile for age and race based on the [place] data base.  - Technically difficult study with motion artifacts and blooming artifacts.  - Normal trajectory of coronaries with right dominant system. - CAD RADS 3, P 3. - Moderately reduced LV systolic function with LVEF 39%. I would consider cardiac echo for better evaluation of LV systolic function, LVEF and LV wall motion abnormalities ( by recent echo LV EF 60-63% as above). - Moderate coronary artery disease with ostial small caliber (1.5-1.45mm caliber) first diagonal with about 50-65% stenosis. Otherwise mild nonobstructive multivessel CAD as above. Mid RCA contains calcified plaque with 25-49% stenosis.  - Study was sent for [facility] evaluation. 1. LAD has a low likelihood of lesion-specific ischemia with an FFRct value of 0.87.  The first diagonal ostial stenosis has a low likelihood of lesion-specific ischemia with an FFRct value of 0.96.  2. LCX has a low likelihood of lesion-specific ischemia with an FFRct value of of 0.9.  3. FFR analysis of Mid RCA 25-49% stenosis apparently couldn't be assessed due to significant CCTA artifact.  - Ascending aorta size at upper limit of normal measuring 33 mm x 33 mm. Extracardiac findings - Cholelithiasis. Stable, partially imaged left renal  simple cysts. No acute changes.   GIVEN ONGOING SYMPTOMS, SUBOPTIMAL CTA, AND ELEVATED CORONARY CALCIFICATION, WILL REFER FOR RHC + CORS RE: DYSPNEA.           Dispo:  Return in about 3 months (around 04/30/2024).      Medication Adjustments/Labs and Tests Ordered: Current medicines are reviewed at length with the patient today.   Concerns regarding medicines are outlined above.  The following changes have been made:     Labs/tests ordered: Orders Placed This Encounter  Procedures   CT CORONARY MORPH W/CTA COR W/SCORE W/CA W/CM &/OR WO/CM   Basic metabolic panel with GFR   Hemoglobin A1c   EKG 12-Lead    Medication Changes: Meds ordered this encounter  Medications   furosemide  (LASIX ) 20 MG tablet    Sig: Take 1 tablet (20 mg total) by mouth daily.    Dispense:  14 tablet    Refill:  0   metoprolol  tartrate (LOPRESSOR ) 100 MG tablet    Sig: Take 1 tablet (100 mg total) by mouth once for 1 dose. Take 90-120 minutes prior to scan.    Dispense:  1 tablet    Refill:  0    Current medicines are reviewed at length with the patient today.  The patient does not have concerns regarding medicines.     History of Present Illness:      FOCUSED PROBLEM LIST:   Asthma/COPD Diastolic dysfunction Mild LVH, EF 55 to 60% TTE 2025 OSH SVT 2 episodes; 4 beats monitor 2024 OSH VT 3 episodes; longest lasting 6 beats monitored 2024 OSH Hyperlipidemia Aortic atherosclerosis Chest CT 2024 CAD Chest CT 2024 CKD stage IIIa BMI 33  May 2025:  Patient consents to use of AI scribe. Patient is a 73 year old female with above listed medical problems referred for recommendations regarding dyspnea.  She experiences shortness of breath during normal activities such as walking short distances or performing household tasks, requiring frequent rest due to breathlessness. This issue has persisted for about a year. No shortness of breath while sitting or lying flat, and no chest pain. She sometimes wheezes when short of breath.  Her asthma medication was recently changed from Trelegy to Vertrix, taken daily. She uses her nebulizer at night and her emergency inhaler frequently, which provide some relief. She has a history of asthma and COPD, using a nebulizer and an emergency inhaler.  She reports a weight loss of 30 pounds,  initially hoping it would improve her breathing, but it has not led to significant changes. No swelling in her legs, blacking out spells, or palpitations. She does not have a staircase at home, limiting her exertion to walking short distances.  Her past medical history includes asthma, COPD, and a previous CT scan showing cholesterol deposits on the aorta. Extra heartbeats were noted from a monitor in 2024, but no follow-up was done at that time. She is currently on a baby aspirin .          Current Medications: Current Meds  Medication Sig   albuterol  (PROVENTIL ) (2.5 MG/3ML) 0.083% nebulizer solution Take 3 mLs (2.5 mg total) by nebulization every 4 (four) hours as needed for wheezing or shortness of breath.   albuterol  (VENTOLIN  HFA) 108 (90 Base) MCG/ACT inhaler Inhale 2 puffs into the lungs every 4 (four) hours as needed for wheezing or shortness of breath.   atorvastatin  (LIPITOR) 40 MG tablet Take 40 mg by mouth daily.   cyclobenzaprine  (FLEXERIL ) 5 MG tablet Take 5 mg by  mouth 3 (three) times daily as needed for muscle spasms.   Fluticasone -Umeclidin-Vilant (TRELEGY ELLIPTA ) 100-62.5-25 MCG/INH AEPB Inhale 1 puff into the lungs daily.   furosemide  (LASIX ) 20 MG tablet Take 1 tablet (20 mg total) by mouth daily.   metoprolol  tartrate (LOPRESSOR ) 100 MG tablet Take 1 tablet (100 mg total) by mouth once for 1 dose. Take 90-120 minutes prior to scan.   montelukast  (SINGULAIR ) 10 MG tablet Take 1 tablet (10 mg total) by mouth at bedtime.   Multiple Vitamins-Minerals (MULTIVITAMINS THER. W/MINERALS) TABS Take 1 tablet by mouth daily.   naproxen sodium (ALEVE) 220 MG tablet Take 440 mg by mouth daily.   Vitamin D, Ergocalciferol, (DRISDOL) 1.25 MG (50000 UNIT) CAPS capsule Take 50,000 Units by mouth once a week.     Review of Systems:   Please see the history of present illness.    All other systems reviewed and are negative.     EKGs/Labs/Other Test Reviewed:   EKG: 2019 sinus  tachycardia with PVCs  EKG Interpretation Date/Time:  Monday Jan 29 2024 11:23:09 EDT Ventricular Rate:  89 PR Interval:  162 QRS Duration:  78 QT Interval:  332 QTC Calculation: 403 R Axis:   77  Text Interpretation: Sinus rhythm with Premature supraventricular complexes Nonspecific T wave abnormality When compared with ECG of 17-Aug-2018 10:03, PREVIOUS ECG IS PRESENT Confirmed by Alyssa Backbone ([id number]) on 01/29/2024 11:24:36 AM         Risk Assessment/Calculations:          Physical Exam:   VS:  BP 126/64   Pulse 89   Ht 5\' 5"  (1.651 m)   Wt 201 lb (91.2 kg)   SpO2 97%   BMI 33.45 kg/m        Wt Readings from Last 3 Encounters:  01/29/24 201 lb (91.2 kg)  12/18/20 229 lb (103.9 kg)  12/04/20 229 lb (103.9 kg)      GENERAL:  No apparent distress, AOx3 HEENT:  No carotid bruits, +2 carotid impulses, no scleral icterus CAR: RRR no murmurs, gallops, rubs, or thrills RES:  Clear to auscultation bilaterally ABD:  Soft, nontender, nondistended, positive bowel sounds x 4 VASC:  +2 radial pulses, +2 carotid pulses NEURO:  CN 2-12 grossly intact; motor and sensory grossly intact PSYCH:  No active depression or anxiety EXT:  No edema, ecchymosis, or cyanosis  Signed, Damyra Luscher K Mabelle Mungin, MD  01/29/2024 12:07 PM    [facility] [address] [place], [place], [place]  [address] Phone: [phone]; Fax: [phone]   Note:  This document was prepared using Dragon voice recognition software and may include unintentional dictation errors.

## 2024-01-29 ENCOUNTER — Ambulatory Visit: Attending: Internal Medicine | Admitting: Internal Medicine

## 2024-01-29 ENCOUNTER — Encounter: Payer: Self-pay | Admitting: Internal Medicine

## 2024-01-29 VITALS — BP 126/64 | HR 89 | Ht 65.0 in | Wt 201.0 lb

## 2024-01-29 DIAGNOSIS — R0602 Shortness of breath: Secondary | ICD-10-CM

## 2024-01-29 DIAGNOSIS — Z6831 Body mass index (BMI) 31.0-31.9, adult: Secondary | ICD-10-CM

## 2024-01-29 DIAGNOSIS — I700 Atherosclerosis of aorta: Secondary | ICD-10-CM

## 2024-01-29 DIAGNOSIS — I2510 Atherosclerotic heart disease of native coronary artery without angina pectoris: Secondary | ICD-10-CM | POA: Diagnosis not present

## 2024-01-29 DIAGNOSIS — I5189 Other ill-defined heart diseases: Secondary | ICD-10-CM

## 2024-01-29 DIAGNOSIS — Z6833 Body mass index (BMI) 33.0-33.9, adult: Secondary | ICD-10-CM

## 2024-01-29 DIAGNOSIS — E785 Hyperlipidemia, unspecified: Secondary | ICD-10-CM | POA: Diagnosis not present

## 2024-01-29 DIAGNOSIS — R002 Palpitations: Secondary | ICD-10-CM

## 2024-01-29 DIAGNOSIS — N1831 Chronic kidney disease, stage 3a: Secondary | ICD-10-CM

## 2024-01-29 DIAGNOSIS — I10 Essential (primary) hypertension: Secondary | ICD-10-CM

## 2024-01-29 MED ORDER — METOPROLOL TARTRATE 100 MG PO TABS: 100.0000 mg | ORAL_TABLET | Freq: Once | ORAL | 0 refills | Status: DC

## 2024-01-29 MED ORDER — FUROSEMIDE 20 MG PO TABS: 20.0000 mg | ORAL_TABLET | Freq: Every day | ORAL | 0 refills | Status: DC

## 2024-01-29 NOTE — Patient Instructions (Addendum)
 Medication Instructions:  Your physician has recommended you make the following change in your medication:  1.) start furosemide (Lasix) 20 mg - take one tablet daily for 2 weeks.  Please let us  know if Lasix is helping your breathing - will refill if it is helpful  *If you need a refill on your cardiac medications before your next appointment, please call your pharmacy*  Lab Work: Return in one week for blood work (bmet, Designer, jewellery) If you have labs (blood work) drawn today and your tests are completely normal, you will receive your results only by: [employer] (if you have MyChart) OR A paper copy in the mail If you have any lab test that is abnormal or we need to change your treatment, we will call you to review the results.  Testing/Procedures: Coronary CT Angiogram- see instructions below  Follow-Up: At [facility], you and your health needs are our priority.  As part of our continuing mission to provide you with exceptional heart care, our providers are all part of one team.  This team includes your primary Cardiologist (physician) and Advanced Practice Providers or APPs (Physician Assistants and Nurse Practitioners) who all work together to provide you with the care you need, when you need it.  Your next appointment:   3 month(s)  Provider:   Arun K Thukkani, MD       Your cardiac CT will be scheduled at one of the below locations:   [facility] [address] [place], [place] [address] [phone]   OR   [facility] and [facility] Tower [address]  [place], [place] [address] Opening January 22, 2024  If scheduled at [facility], please arrive at the [facility] and Children's Entrance (Entrance C2) of [facility] 30 minutes prior to test start time. You can use the FREE valet parking offered at entrance C (encouraged to control the heart rate for the test)  Proceed to the [facility] Radiology Department (first floor) to  check-in and test prep.   All radiology patients and guests should use entrance C2 at [facility], accessed from [facility], even though the hospital's physical address listed is [address].    If scheduled at the Heart and Vascular Tower at [address], please enter the parking lot using the [address] entrance and use the FREE valet service at the patient drop-off area. Enter the buidling and check-in with registration on the main floor.  Please follow these instructions carefully (unless otherwise directed):  An IV will be required for this test and Nitroglycerin will be given.   On the Night Before the Test: Be sure to Drink plenty of water. Do not consume any caffeinated/decaffeinated beverages or chocolate 12 hours prior to your test. Do not take any antihistamines 12 hours prior to your test.  On the Day of the Test: Drink plenty of water until 1 hour prior to the test. Do not eat any food 1 hour prior to test. You may take your regular medications prior to the test.  Take metoprolol (Lopressor) two hours prior to test. Patients who wear a continuous glucose monitor MUST remove the device prior to scanning. FEMALES- please wear underwire-free bra if available, avoid dresses & tight clothing       After the Test: Drink plenty of water. After receiving IV contrast, you may experience a mild flushed feeling. This is normal. On occasion, you may experience a mild rash up  to 24 hours after the test. This is not dangerous. If this occurs, you can take Benadryl 25 mg, Zyrtec, Claritin, or Allegra and increase your fluid intake. (Patients taking Tikosyn should avoid Benadryl, and may take Zyrtec, Claritin, or Allegra) If you experience trouble breathing, this can be serious. If it is severe call 911 IMMEDIATELY. If it is mild, please call our office.  We will call to schedule your test 2-4 weeks out understanding that some insurance companies  will need an authorization prior to the service being performed.   For more information and frequently asked questions, please visit our website : [email or web address]  For non-scheduling related questions, please contact the cardiac imaging nurse navigator should you have any questions/concerns: Cardiac Imaging Nurse Navigators Direct Office Dial: [phone]   For scheduling needs, including cancellations and rescheduling, please call [place], [phone].

## 2024-02-14 ENCOUNTER — Ambulatory Visit (HOSPITAL_COMMUNITY)

## 2024-02-20 ENCOUNTER — Telehealth: Payer: Self-pay | Admitting: *Deleted

## 2024-02-20 DIAGNOSIS — Z01812 Encounter for preprocedural laboratory examination: Secondary | ICD-10-CM

## 2024-02-20 NOTE — Telephone Encounter (Signed)
 Message from Dr. Lorie Rook that patient has had coronary CT angiogram at outside facility and will now need right and left heart cath.     I left a message (not sure it went through).  Let her know that since she had a CT scan already which was somewhat abnormal, the next step is cath.  if I had known she had this done, I would have recommended cath.

## 2024-02-22 ENCOUNTER — Ambulatory Visit (HOSPITAL_COMMUNITY)

## 2024-02-23 NOTE — Telephone Encounter (Signed)
 Called and spoke with the patient.  She did receive the voicemail from Dr. Lorie Rook.  We discussed plan for left heart cath and I have scheduled her for Tue June 3.  All instructions reviewed by phone and sent via MyChart.  Pt will come by for lab work today.  She reports that she will not have someone to be at home w her for 24 hrs after so we have requested a bed overnight.

## 2024-02-24 LAB — BASIC METABOLIC PANEL WITH GFR
BUN/Creatinine Ratio: 15 (ref 12–28)
BUN: 16 mg/dL (ref 8–27)
CO2: 20 mmol/L (ref 20–29)
Calcium: 9.7 mg/dL (ref 8.7–10.3)
Chloride: 105 mmol/L (ref 96–106)
Creatinine, Ser: 1.04 mg/dL — ABNORMAL HIGH (ref 0.57–1.00)
Glucose: 86 mg/dL (ref 70–99)
Potassium: 4.6 mmol/L (ref 3.5–5.2)
Sodium: 143 mmol/L (ref 134–144)
eGFR: 57 mL/min/{1.73_m2} — ABNORMAL LOW (ref >59)

## 2024-02-24 LAB — CBC
Hematocrit: 34.7 % (ref 34.0–46.6)
Hemoglobin: 10.8 g/dL — ABNORMAL LOW (ref 11.1–15.9)
MCH: 28.1 pg (ref 26.6–33.0)
MCHC: 31.1 g/dL — ABNORMAL LOW (ref 31.5–35.7)
MCV: 90 fL (ref 79–97)
Platelets: 329 10*3/uL (ref 150–450)
RBC: 3.85 x10E6/uL (ref 3.77–5.28)
RDW: 13 % (ref 11.7–15.4)
WBC: 9.2 10*3/uL (ref 3.4–10.8)

## 2024-02-26 ENCOUNTER — Telehealth: Payer: Self-pay | Admitting: *Deleted

## 2024-02-26 NOTE — Telephone Encounter (Signed)
 Cardiac Catheterization scheduled at [facility] for: Tuesday February 27, 2024 9 AM Arrival time [facility] Main Entrance A at: 7 AM  Nothing to eat after midnight prior to procedure, clear liquids until 5 AM day of procedure.  Medication instructions: -Hold:  Hydrochlorothiazide-day before and day of procedure-per protocol GFR <60 (57)  Pt reports she does not take lasix .  Ozempic-weekly on Tuesdays -will hold until after procedure -Other usual morning medications can be taken with sips of water including aspirin  81 mg.  Pt tells me she does not have anyone to drive her home or be with her first 24 hours if same day discharge, will need to stay overnight and plans to drive herself home the next day.  Reviewed procedure instructions with patient.

## 2024-02-27 ENCOUNTER — Encounter (HOSPITAL_COMMUNITY)
Admission: RE | Disposition: A | Discharge status: Home / Self Care | Payer: Self-pay | Source: Home / Self Care | Attending: Internal Medicine

## 2024-02-27 ENCOUNTER — Other Ambulatory Visit: Payer: Self-pay

## 2024-02-27 ENCOUNTER — Observation Stay (HOSPITAL_COMMUNITY)
Admission: RE | Admit: 2024-02-27 | Discharge: 2024-02-28 | Disposition: A | Discharge status: Home / Self Care | Attending: Internal Medicine | Admitting: Internal Medicine

## 2024-02-27 ENCOUNTER — Encounter (HOSPITAL_COMMUNITY): Payer: Self-pay | Admitting: Internal Medicine

## 2024-02-27 DIAGNOSIS — I2510 Atherosclerotic heart disease of native coronary artery without angina pectoris: Secondary | ICD-10-CM | POA: Diagnosis not present

## 2024-02-27 DIAGNOSIS — J449 Chronic obstructive pulmonary disease, unspecified: Secondary | ICD-10-CM | POA: Diagnosis not present

## 2024-02-27 DIAGNOSIS — N1831 Chronic kidney disease, stage 3a: Secondary | ICD-10-CM | POA: Insufficient documentation

## 2024-02-27 DIAGNOSIS — Z6833 Body mass index (BMI) 33.0-33.9, adult: Secondary | ICD-10-CM | POA: Insufficient documentation

## 2024-02-27 DIAGNOSIS — Z79899 Other long term (current) drug therapy: Secondary | ICD-10-CM | POA: Insufficient documentation

## 2024-02-27 DIAGNOSIS — E785 Hyperlipidemia, unspecified: Secondary | ICD-10-CM | POA: Insufficient documentation

## 2024-02-27 DIAGNOSIS — I700 Atherosclerosis of aorta: Secondary | ICD-10-CM | POA: Insufficient documentation

## 2024-02-27 DIAGNOSIS — R002 Palpitations: Secondary | ICD-10-CM | POA: Diagnosis not present

## 2024-02-27 DIAGNOSIS — R0600 Dyspnea, unspecified: Secondary | ICD-10-CM | POA: Diagnosis present

## 2024-02-27 DIAGNOSIS — I129 Hypertensive chronic kidney disease with stage 1 through stage 4 chronic kidney disease, or unspecified chronic kidney disease: Secondary | ICD-10-CM | POA: Insufficient documentation

## 2024-02-27 HISTORY — PX: RIGHT/LEFT HEART CATH AND CORONARY ANGIOGRAPHY: CATH118266

## 2024-02-27 LAB — POCT I-STAT 7, (LYTES, BLD GAS, ICA,H+H)
Acid-base deficit: 2 mmol/L (ref 0.0–2.0)
Bicarbonate: 22.3 mmol/L (ref 20.0–28.0)
Calcium, Ion: 1.22 mmol/L (ref 1.15–1.40)
HCT: 26 % — ABNORMAL LOW (ref 36.0–46.0)
Hemoglobin: 8.8 g/dL — ABNORMAL LOW (ref 12.0–15.0)
O2 Saturation: 95 %
Potassium: 3.9 mmol/L (ref 3.5–5.1)
Sodium: 141 mmol/L (ref 135–145)
TCO2: 23 mmol/L (ref 22–32)
pCO2 arterial: 36.8 mmHg (ref 32–48)
pH, Arterial: 7.389 (ref 7.35–7.45)
pO2, Arterial: 76 mmHg — ABNORMAL LOW (ref 83–108)

## 2024-02-27 LAB — POCT I-STAT EG7
Acid-base deficit: 2 mmol/L (ref 0.0–2.0)
Acid-base deficit: 3 mmol/L — ABNORMAL HIGH (ref 0.0–2.0)
Bicarbonate: 23.3 mmol/L (ref 20.0–28.0)
Bicarbonate: 22.7 mmol/L (ref 20.0–28.0)
Calcium, Ion: 1.19 mmol/L (ref 1.15–1.40)
Calcium, Ion: 1.21 mmol/L (ref 1.15–1.40)
HCT: 26 % — ABNORMAL LOW (ref 36.0–46.0)
HCT: 26 % — ABNORMAL LOW (ref 36.0–46.0)
Hemoglobin: 8.8 g/dL — ABNORMAL LOW (ref 12.0–15.0)
Hemoglobin: 8.8 g/dL — ABNORMAL LOW (ref 12.0–15.0)
O2 Saturation: 69 %
O2 Saturation: 64 %
Potassium: 3.8 mmol/L (ref 3.5–5.1)
Potassium: 3.9 mmol/L (ref 3.5–5.1)
Sodium: 143 mmol/L (ref 135–145)
Sodium: 142 mmol/L (ref 135–145)
TCO2: 25 mmol/L (ref 22–32)
TCO2: 24 mmol/L (ref 22–32)
pCO2, Ven: 41.7 mmHg — ABNORMAL LOW (ref 44–60)
pCO2, Ven: 40 mmHg — ABNORMAL LOW (ref 44–60)
pH, Ven: 7.354 (ref 7.25–7.43)
pH, Ven: 7.361 (ref 7.25–7.43)
pO2, Ven: 38 mmHg (ref 32–45)
pO2, Ven: 35 mmHg (ref 32–45)

## 2024-02-27 MED ORDER — VERAPAMIL HCL 2.5 MG/ML IV SOLN
INTRAVENOUS | Status: AC
  Filled 2024-02-27: qty 2

## 2024-02-27 MED ORDER — LIDOCAINE HCL (PF) 1 % IJ SOLN
INTRAMUSCULAR | Status: DC | PRN
  Administered 2024-02-27 (×2): 2 mL

## 2024-02-27 MED ORDER — HYDRALAZINE HCL 20 MG/ML IJ SOLN: 10.0000 mg | INTRAMUSCULAR | Status: AC | PRN

## 2024-02-27 MED ORDER — ACETAMINOPHEN 325 MG PO TABS: 650.0000 mg | ORAL_TABLET | ORAL | Status: DC | PRN

## 2024-02-27 MED ORDER — MIDAZOLAM HCL 2 MG/2ML IJ SOLN
INTRAMUSCULAR | Status: DC | PRN
Start: 2024-02-27 — End: 2024-02-27
  Administered 2024-02-27: 1 mg via INTRAVENOUS

## 2024-02-27 MED ORDER — AMLODIPINE BESYLATE 10 MG PO TABS
10.0000 mg | ORAL_TABLET | Freq: Every day | ORAL | Status: DC
  Administered 2024-02-28: 10 mg via ORAL
  Filled 2024-02-27: qty 1

## 2024-02-27 MED ORDER — SODIUM CHLORIDE 0.9 % IV SOLN: INTRAVENOUS | Status: DC

## 2024-02-27 MED ORDER — METOPROLOL SUCCINATE ER 25 MG PO TB24
25.0000 mg | ORAL_TABLET | Freq: Every day | ORAL | Status: DC
  Administered 2024-02-27: 25 mg via ORAL
  Filled 2024-02-27: qty 1

## 2024-02-27 MED ORDER — HEPARIN (PORCINE) IN NACL 1000-0.9 UT/500ML-% IV SOLN
INTRAVENOUS | Status: DC | PRN
Start: 2024-02-27 — End: 2024-02-27
  Administered 2024-02-27 (×2): 500 mL

## 2024-02-27 MED ORDER — ONDANSETRON HCL 4 MG/2ML IJ SOLN
4.0000 mg | Freq: Four times a day (QID) | INTRAMUSCULAR | Status: DC | PRN
Start: 2024-02-27 — End: 2024-02-28

## 2024-02-27 MED ORDER — IOHEXOL 350 MG/ML SOLN
INTRAVENOUS | Status: DC | PRN
Start: 2024-02-27 — End: 2024-02-27
  Administered 2024-02-27: 35 mL

## 2024-02-27 MED ORDER — LABETALOL HCL 5 MG/ML IV SOLN: 10.0000 mg | INTRAVENOUS | Status: AC | PRN

## 2024-02-27 MED ORDER — HEPARIN SODIUM (PORCINE) 1000 UNIT/ML IJ SOLN
INTRAMUSCULAR | Status: AC
Start: 2024-02-27 — End: ?
  Filled 2024-02-27: qty 10

## 2024-02-27 MED ORDER — SODIUM CHLORIDE 0.9 % IV SOLN: 250.0000 mL | INTRAVENOUS | Status: DC | PRN

## 2024-02-27 MED ORDER — SODIUM CHLORIDE 0.9% FLUSH
3.0000 mL | Freq: Two times a day (BID) | INTRAVENOUS | Status: DC
  Administered 2024-02-27 – 2024-02-28 (×2): 3 mL via INTRAVENOUS

## 2024-02-27 MED ORDER — VERAPAMIL HCL 2.5 MG/ML IV SOLN
INTRAVENOUS | Status: DC | PRN
  Administered 2024-02-27: 8 mL via INTRA_ARTERIAL

## 2024-02-27 MED ORDER — FENTANYL CITRATE (PF) 100 MCG/2ML IJ SOLN
INTRAMUSCULAR | Status: DC | PRN
  Administered 2024-02-27: 25 ug via INTRAVENOUS

## 2024-02-27 MED ORDER — ATORVASTATIN CALCIUM 40 MG PO TABS
40.0000 mg | ORAL_TABLET | Freq: Every day | ORAL | Status: DC
  Administered 2024-02-28: 40 mg via ORAL
  Filled 2024-02-27: qty 1

## 2024-02-27 MED ORDER — LIDOCAINE HCL (PF) 1 % IJ SOLN
INTRAMUSCULAR | Status: AC
Start: 2024-02-27 — End: ?
  Filled 2024-02-27: qty 30

## 2024-02-27 MED ORDER — HEPARIN SODIUM (PORCINE) 1000 UNIT/ML IJ SOLN
INTRAMUSCULAR | Status: DC | PRN
  Administered 2024-02-27: 5000 [IU] via INTRAVENOUS

## 2024-02-27 MED ORDER — SODIUM CHLORIDE 0.9% FLUSH: 3.0000 mL | INTRAVENOUS | Status: DC | PRN

## 2024-02-27 MED ORDER — MIDAZOLAM HCL 2 MG/2ML IJ SOLN
INTRAMUSCULAR | Status: AC
  Filled 2024-02-27: qty 2

## 2024-02-27 MED ORDER — FENTANYL CITRATE (PF) 100 MCG/2ML IJ SOLN
INTRAMUSCULAR | Status: AC
  Filled 2024-02-27: qty 2

## 2024-02-27 MED ORDER — SODIUM CHLORIDE 0.9 % IV SOLN: INTRAVENOUS | Status: AC

## 2024-02-27 MED ORDER — ASPIRIN 81 MG PO CHEW: 81.0000 mg | CHEWABLE_TABLET | ORAL | Status: DC

## 2024-02-27 SURGICAL SUPPLY — 12 items
CATH BALLN WEDGE 5F 110CM (CATHETERS) IMPLANT
CATH INFINITI 5FR ANG PIGTAIL (CATHETERS) IMPLANT
CATH INFINITI AMBI 6FR TG (CATHETERS) IMPLANT
DEVICE RAD COMP TR BAND LRG (VASCULAR PRODUCTS) IMPLANT
GLIDESHEATH SLEND SS 6F .021 (SHEATH) IMPLANT
KIT SYRINGE INJ CVI SPIKEX1 (MISCELLANEOUS) IMPLANT
PACK CARDIAC CATHETERIZATION (CUSTOM PROCEDURE TRAY) ×1 IMPLANT
SET ATX-X65L (MISCELLANEOUS) IMPLANT
SHEATH GLIDE SLENDER 4/5FR (SHEATH) IMPLANT
SHEATH PROBE COVER 6X72 (BAG) IMPLANT
WIRE EMERALD 3MM-J .035X260CM (WIRE) IMPLANT
WIRE MICRO SET SILHO 5FR 7 (SHEATH) IMPLANT

## 2024-02-27 NOTE — Interval H&P Note (Signed)
 History and Physical Interval Note:  02/27/2024 6:55 AM  Diane Shaw  has presented today for surgery, with the diagnosis of shortness of breath - angina.  The various methods of treatment have been discussed with the patient and family. After consideration of risks, benefits and other options for treatment, the patient has consented to  Procedure(s): RIGHT/LEFT HEART CATH AND CORONARY ANGIOGRAPHY (N/A) as a surgical intervention.  The patient's history has been reviewed, patient examined, no change in status, stable for surgery.  I have reviewed the patient's chart and labs.  Questions were answered to the patient's satisfaction.     Talasia Saulter K Jalexus Brett

## 2024-02-27 NOTE — Progress Notes (Signed)
 TR BAND REMOVAL  LOCATION:    Right Radial  DEFLATED PER PROTOCOL:   yes  TIME BAND OFF / DRESSING APPLIED:    1130  SITE UPON ARRIVAL:    Level 0  SITE AFTER BAND REMOVAL:    Level 0  CIRCULATION SENSATION AND MOVEMENT:    Within Normal Limits : yes  COMMENTS:   Post procedure instructions given w/ teach back

## 2024-02-28 DIAGNOSIS — R0600 Dyspnea, unspecified: Secondary | ICD-10-CM | POA: Diagnosis not present

## 2024-02-28 LAB — CBC
HCT: 30.6 % — ABNORMAL LOW (ref 36.0–46.0)
Hemoglobin: 10 g/dL — ABNORMAL LOW (ref 12.0–15.0)
MCH: 28.8 pg (ref 26.0–34.0)
MCHC: 32.7 g/dL (ref 30.0–36.0)
MCV: 88.2 fL (ref 80.0–100.0)
Platelets: 289 10*3/uL (ref 150–400)
RBC: 3.47 MIL/uL — ABNORMAL LOW (ref 3.87–5.11)
RDW: 12.9 % (ref 11.5–15.5)
WBC: 8.2 10*3/uL (ref 4.0–10.5)
nRBC: 0 % (ref 0.0–0.2)

## 2024-02-28 LAB — BASIC METABOLIC PANEL WITH GFR
Anion gap: 7 (ref 5–15)
BUN: 12 mg/dL (ref 8–23)
CO2: 23 mmol/L (ref 22–32)
Calcium: 9.1 mg/dL (ref 8.9–10.3)
Chloride: 108 mmol/L (ref 98–111)
Creatinine, Ser: 0.96 mg/dL (ref 0.44–1.00)
GFR, Estimated: >60 mL/min (ref >60)
Glucose, Bld: 79 mg/dL (ref 70–99)
Potassium: 4.2 mmol/L (ref 3.5–5.1)
Sodium: 138 mmol/L (ref 135–145)

## 2024-02-28 LAB — MAGNESIUM: Magnesium: 1.9 mg/dL (ref 1.7–2.4)

## 2024-02-28 LAB — LIPID PANEL
Cholesterol: 155 mg/dL (ref 0–200)
HDL: 39 mg/dL — ABNORMAL LOW (ref >40)
LDL Cholesterol: 93 mg/dL (ref 0–99)
Total CHOL/HDL Ratio: 4 ratio
Triglycerides: 117 mg/dL (ref <150)
VLDL: 23 mg/dL (ref 0–40)

## 2024-02-28 MED ORDER — METOPROLOL SUCCINATE ER 25 MG PO TB24: 25.0000 mg | ORAL_TABLET | Freq: Every day | ORAL | 2 refills | Status: DC

## 2024-02-28 MED ORDER — ATORVASTATIN CALCIUM 80 MG PO TABS: 80.0000 mg | ORAL_TABLET | Freq: Every day | ORAL | 3 refills | Status: DC

## 2024-02-28 NOTE — TOC CM/SW Note (Signed)
 Transition of Care [facility]) - Inpatient Brief Assessment   Patient Details  Name: Diane Shaw MRN: [id number] Date of Birth: July 25, 1951  Transition of Care [facility]) CM/SW Contact:    Cosimo Diones, RN Phone Number: 02/28/2024, 10:19 AM   Clinical Narrative: Patient presented for shortness of breath. PTA patient states she was from home alone. Patient still drives to PCP appointments and gets medications without any issues. No home needs identified during this visit.    Transition of Care Asessment: Insurance and Status: Insurance coverage has been reviewed Patient has primary care physician: Yes Home environment has been reviewed: reviewed Prior level of function:: independent Prior/Current Home Services: No current home services Social Drivers of Health Review: SDOH reviewed no interventions necessary Readmission risk has been reviewed: Yes Transition of care needs: no transition of care needs at this time

## 2024-02-28 NOTE — Plan of Care (Signed)
  Problem: Education: Goal: Understanding of CV disease, CV risk reduction, and recovery process will improve Outcome: Progressing Goal: Individualized Educational Video(s) Outcome: Progressing   Problem: Activity: Goal: Ability to return to baseline activity level will improve Outcome: Progressing   Problem: Cardiovascular: Goal: Ability to achieve and maintain adequate cardiovascular perfusion will improve Outcome: Progressing Goal: Vascular access site(s) Level 0-1 will be maintained Outcome: Progressing   Problem: Health Behavior/Discharge Planning: Goal: Ability to safely manage health-related needs after discharge will improve Outcome: Progressing   Problem: Education: Goal: Knowledge of General Education information will improve Description: Including pain rating scale, medication(s)/side effects and non-pharmacologic comfort measures Outcome: Progressing   Problem: Health Behavior/Discharge Planning: Goal: Ability to manage health-related needs will improve Outcome: Progressing   Problem: Clinical Measurements: Goal: Ability to maintain clinical measurements within normal limits will improve Outcome: Progressing Goal: Will remain free from infection Outcome: Progressing Goal: Cardiovascular complication will be avoided Outcome: Progressing   Problem: Activity: Goal: Risk for activity intolerance will decrease Outcome: Progressing   Problem: Nutrition: Goal: Adequate nutrition will be maintained Outcome: Progressing   Problem: Coping: Goal: Level of anxiety will decrease Outcome: Progressing   Problem: Elimination: Goal: Will not experience complications related to bowel motility Outcome: Progressing Goal: Will not experience complications related to urinary retention Outcome: Progressing   Problem: Pain Managment: Goal: General experience of comfort will improve and/or be controlled Outcome: Progressing   Problem: Safety: Goal: Ability to remain  free from injury will improve Outcome: Progressing   Problem: Skin Integrity: Goal: Risk for impaired skin integrity will decrease Outcome: Progressing

## 2024-02-28 NOTE — Discharge Instructions (Signed)
Radial Site Care Refer to this sheet in the next few weeks. These instructions provide you with information on caring for yourself after your procedure. Your caregiver may also give you more specific instructions. Your treatment has been planned according to current medical practices, but problems sometimes occur. Call your caregiver if you have any problems or questions after your procedure. HOME CARE INSTRUCTIONS  You may shower the day after the procedure.Remove the bandage (dressing) and gently wash the site with plain soap and water.Gently pat the site dry.   Do not apply powder or lotion to the site.   Do not submerge the affected site in water for 3 to 5 days.   Inspect the site at least twice daily.   Do not flex or bend the affected arm for 24 hours.   No lifting over 5 pounds (2.3 kg) for 5 days after your procedure.   Do not drive home if you are discharged the same day of the procedure. Have someone else drive you.   You may drive 24 hours after the procedure unless otherwise instructed by your caregiver.  What to expect:  Any bruising will usually fade within 1 to 2 weeks.   Blood that collects in the tissue (hematoma) may be painful to the touch. It should usually decrease in size and tenderness within 1 to 2 weeks.  SEEK IMMEDIATE MEDICAL CARE IF:  You have unusual pain at the radial site.   You have redness, warmth, swelling, or pain at the radial site.   You have drainage (other than a small amount of blood on the dressing).   You have chills.   You have a fever or persistent symptoms for more than 72 hours.   You have a fever and your symptoms suddenly get worse.   Your arm becomes pale, cool, tingly, or numb.   You have heavy bleeding from the site. Hold pressure on the site.

## 2024-02-28 NOTE — Discharge Summary (Signed)
 Discharge Summary   Patient ID: Diane Shaw MRN: [id number]; DOB: Jun 09, 1951  Admit date: 02/27/2024 Discharge date: 02/28/2024  PCP:  Maryellen Snare, NP   [facility] Providers Cardiologist:  Kyra Phy, MD    Discharge Diagnoses  Principal Problem:   Dyspnea   Diagnostic Studies/Procedures  Left and right cardiac catheterization on 02/27/2024    1st Diag lesion is 70% stenosed.   Prox LAD lesion is 30% stenosed.   Prox RCA lesion is 20% stenosed.   1.  Mild, nonobstructive coronary artery disease. 2.  Fick cardiac output of 6.8 L/min and Fick cardiac index of 3.5 L/min/m with the following hemodynamics:            Right atrial pressure mean of 2 mmHg            Right ventricular pressure 22/3 with an end-diastolic pressure of 4 mmHg            Wedge pressure mean of 5 mmHg with V waves to 9 mmHg            PA pressure 26/7 with a mean of 15 mmHg            PVR of 1.5 Woods units            PA pulsatility index of greater than 5 3.  LVEDP of 4 to 5 mmHg   Recommendation: In conjunction with outside hospital echocardiogram demonstrating reassuring findings, would recommend evaluation for noncardiac etiologies of dyspnea. _____________   History of Present Illness   Diane Shaw is a 73 y.o. female with CAD on coronary CTA, hyperlipidemia, aortic atherosclerosis, diastolic dysfunction, stage IIIa CKD, primary hypertension, BMI of 33, palpitations, and dyspnea on exertion.  Was seen by Dr. Clarita Cross on 01/29/2024 and a coronary CTA was ordered because of the dyspnea on exertion.  It was later discovered that the patient had already had a coronary CTA done at [facility].  The coronary CTA was concerning for a calcium  score of 480, 92nd percentile, and a complete occlusion of the LAD.  Was scheduled for an outpatient for cardiac catheterization on 02/27/2024.    Hospital Course    Cardiac catheterization was performed on 02/27/2024 by Dr. Lorie Rook and showed moderate  nonobstructive CAD, and normal right-sided heart pressures as mentioned above.  Echo in 12/2023 showed normal LV function, grossly normal valve function, and normal wall motion. Therefore, dyspnea not felt to be cardiac in nature. She was kept overnight for observation given she lives alone and had driven herself to the hospital.  I-STAT labs were concerning for a worsening hemoglobin but a CBC was repeated in the morning and showed a hemoglobin of 10 which is about her baseline.  Labs also showed normal renal function and electrolytes.  Lipid panel and LPA were also ordered and are pending Goal LDL less than 70. Overnight the patient did well but continues to have her chronic dyspnea on exertion.  She denies any other symptoms concerning for ACS.  On 02/28/2024 patient was felt to be stable for discharge. An outpatient pulmonary evaluation was scheduled on 03/06/2024 with Margaretann Sharper, MD. Follow-ups were scheduled with cardiology.  (Dayna Dunn PA-C on 03/25/2024 and Dr. Lorie Rook  on 06/04/24.) - Continue Lipitor 40 mg.  May reassess lipid management and outpatient follow-up. - Continue aspirin  81mg  daily - Continue metoprolol  succinate 25 every night, Continue amlodipine  10 mg daily, restart home hydrochlorothiazide 12.5 mg daily. - Stop Lasix  as patient reported  this was a 15-day trial and did not help her shortness of breath. - Continue home Ozempic, albuterol  inhaler and nebulizer, fluticasone  nasal spray, montelukast , and Trelegy inhaler     Did the patient have an acute coronary syndrome (MI, NSTEMI, STEMI, etc) this admission?:  No                                Did the patient have a percutaneous coronary intervention (stent / angioplasty)?:  No.          _____________  Discharge Vitals Blood pressure 136/74, pulse 83, temperature 97.8 F (36.6 C), temperature source Oral, resp. rate 18, height 5\' 5"  (1.651 m), weight 92.5 kg, SpO2 99%.  Filed Weights   02/27/24 0657 02/27/24 1347   Weight: 89.4 kg 92.5 kg   Telemetry/ECG  Normal sinus rhythm with resting rates in the 70s to 90s- Personally Reviewed  Physical Exam  GEN: Overweight female lying in bed in acute distress appearing younger than stated age.   Neck: No JVD. Cardiac: RRR, no murmurs, rubs, or gallops.  Respiratory: Clear to auscultation bilaterally. GI: Soft, nontender, non-distended  MS: 1+ edema, cath site clean with minimal observable hematoma.  No swelling or tenderness  Labs & Radiologic Studies  CBC Recent Labs    02/27/24 0825 02/28/24 1011  WBC  --  8.2  HGB 8.8* 10.0*  HCT 26.0* 30.6*  MCV  --  88.2  PLT  --  289   Basic Metabolic Panel Recent Labs    16/10/96 0825 02/28/24 1011  NA 142 138  K 3.9 4.2  CL  --  108  CO2  --  23  GLUCOSE  --  79  BUN  --  12  CREATININE  --  0.96  CALCIUM   --  9.1  MG  --  1.9   Liver Function Tests No results for input(s): "AST", "ALT", "ALKPHOS", "BILITOT", "PROT", "ALBUMIN" in the last 72 hours. No results for input(s): "LIPASE", "AMYLASE" in the last 72 hours. High Sensitivity Troponin:   No results for input(s): "TROPONINIHS" in the last 720 hours.  No results for input(s): "TRNPT" in the last 720 hours.  BNP Invalid input(s): "POCBNP" No results for input(s): "PROBNP" in the last 72 hours.  No results for input(s): "BNP" in the last 72 hours.  D-Dimer No results for input(s): "DDIMER" in the last 72 hours. Hemoglobin A1C No results for input(s): "HGBA1C" in the last 72 hours. Fasting Lipid Panel Recent Labs    02/28/24 1011  CHOL 155  HDL 39*  LDLCALC 93  TRIG 045  CHOLHDL 4.0   No results found for: "LIPOA"  Thyroid Function Tests No results for input(s): "TSH", "T4TOTAL", "T3FREE", "THYROIDAB" in the last 72 hours.  Invalid input(s): "FREET3" _____________  CARDIAC CATHETERIZATION Result Date: 02/27/2024   1st Diag lesion is 70% stenosed.   Prox LAD lesion is 30% stenosed.   Prox RCA lesion is 20% stenosed. 1.   Mild, nonobstructive coronary artery disease. 2.  Fick cardiac output of 6.8 L/min and Fick cardiac index of 3.5 L/min/m with the following hemodynamics:  Right atrial pressure mean of 2 mmHg  Right ventricular pressure 22/3 with an end-diastolic pressure of 4 mmHg  Wedge pressure mean of 5 mmHg with V waves to 9 mmHg  PA pressure 26/7 with a mean of 15 mmHg  PVR of 1.5 Woods units  PA pulsatility index of greater than  5 3.  LVEDP of 4 to 5 mmHg Recommendation: In conjunction with outside hospital echocardiogram demonstrating reassuring findings, would recommend evaluation for noncardiac etiologies of dyspnea.    Disposition Pt is being discharged home today in good condition.  Follow-up Plans & Appointments  Follow-up Information     Margaretann Sharper, MD Follow up.   Specialty: Pulmonary Disease Why: Pulmonology follow-up with Margaretann Sharper, MD on 03/06/2024 at 11:15 AM.  Please arrive 15 minutes early. Contact information: [address] [place] [address] [phone]         Thukkani, Arun K, MD Follow up.   Specialty: Cardiology Why: Cardiology follow-up with Dr. Alyssa Backbone on 06/04/2024 at 2:20 PM.  Please arrive 15 minutes early. Contact information: [address] [place] [address] [phone]         Alexandria Angel, PA-C Follow up.   Specialties: Cardiology, Radiology Why: Cardiology follow-up with Dunn, Dayna N, PA-C on 03/25/2024 at 8:25 AM.  Please arrive 15 minutes early.  Dayna is a member of the heart care team at [facility]. Contact information: [address] [place] [address] [phone]                Discharge Instructions     Diet - low sodium heart healthy   Complete by: As directed    Increase activity slowly   Complete by: As directed        Discharge Medications Allergies as of 02/28/2024   No Known Allergies      Medication List     STOP taking these medications    furosemide  20 MG  tablet Commonly known as: LASIX        TAKE these medications    albuterol  108 (90 Base) MCG/ACT inhaler Commonly known as: VENTOLIN  HFA Inhale 2 puffs into the lungs every 4 (four) hours as needed for wheezing or shortness of breath.   albuterol  0.63 MG/3ML nebulizer solution Commonly known as: ACCUNEB  Take 1 ampule by nebulization daily as needed (asthma).   amLODipine  10 MG tablet Commonly known as: NORVASC  Take 10 mg by mouth daily.   aspirin  EC 81 MG tablet Take 81 mg by mouth daily. Swallow whole.   atorvastatin  80 MG tablet Commonly known as: LIPITOR Take 1 tablet (80 mg total) by mouth daily. What changed:  medication strength how much to take   fluticasone  50 MCG/ACT nasal spray Commonly known as: FLONASE Place 2 sprays into both nostrils daily as needed for allergies.   hydrochlorothiazide 12.5 MG tablet Commonly known as: HYDRODIURIL Take 12.5 mg by mouth daily.   metoprolol  succinate 25 MG 24 hr tablet Commonly known as: TOPROL -XL Take 1 tablet (25 mg total) by mouth at bedtime. What changed: when to take this   montelukast  10 MG tablet Commonly known as: SINGULAIR  Take 1 tablet (10 mg total) by mouth at bedtime.   multivitamins ther. w/minerals Tabs tablet Take 1 tablet by mouth daily.   Ozempic (2 MG/DOSE) 8 MG/3ML Sopn Generic drug: Semaglutide (2 MG/DOSE) Inject 2 mg into the skin once a week.   Trelegy Ellipta  100-62.5-25 MCG/INH Aepb Generic drug: Fluticasone -Umeclidin-Vilant Inhale 1 puff into the lungs daily.   vitamin B-12 500 MCG tablet Commonly known as: CYANOCOBALAMIN  Take 500 mcg by mouth daily.         Outstanding Labs/Studies   Duration of Discharge Encounter: APP Time: 20 minutes   Signed, Melita Springer, PA-C 02/28/2024, 12:23 PM

## 2024-03-01 ENCOUNTER — Ambulatory Visit: Payer: Self-pay | Admitting: Internal Medicine

## 2024-03-01 DIAGNOSIS — E785 Hyperlipidemia, unspecified: Secondary | ICD-10-CM

## 2024-03-01 LAB — LIPOPROTEIN A (LPA): Lipoprotein (a): 76.5 nmol/L — ABNORMAL HIGH (ref <75.0)

## 2024-03-04 MED ORDER — EZETIMIBE 10 MG PO TABS: 10.0000 mg | ORAL_TABLET | Freq: Every day | ORAL | 3 refills | Status: DC

## 2024-03-06 ENCOUNTER — Ambulatory Visit: Admitting: Pulmonary Disease

## 2024-03-06 VITALS — BP 129/76 | HR 89 | Ht 65.0 in | Wt 201.2 lb

## 2024-03-06 DIAGNOSIS — R0602 Shortness of breath: Secondary | ICD-10-CM

## 2024-03-06 DIAGNOSIS — K219 Gastro-esophageal reflux disease without esophagitis: Secondary | ICD-10-CM

## 2024-03-06 DIAGNOSIS — J4540 Moderate persistent asthma, uncomplicated: Secondary | ICD-10-CM | POA: Diagnosis not present

## 2024-03-06 DIAGNOSIS — Q320 Congenital tracheomalacia: Secondary | ICD-10-CM | POA: Diagnosis not present

## 2024-03-06 MED ORDER — TRELEGY ELLIPTA 200-62.5-25 MCG/ACT IN AEPB: 1.0000 | INHALATION_SPRAY | Freq: Every day | RESPIRATORY_TRACT | 3 refills | Status: DC

## 2024-03-06 NOTE — Progress Notes (Signed)
 Diane Shaw    [id number]    [birth year]  Primary Care Physician:Stowe, Levell Reach, NP  Referring Physician: Maryellen Snare, NP [address] [place],  [place] [address]  Chief complaint:    Patient being seen for shortness of breath on activity HPI:  Shortness of breath with activity  Background history of asthma  Currently using Trelegy 100, had previously been on Advair   Never smoker Struggles more during the summer months  Previous PFT from 2013 with moderate obstruction with no significant bronchodilator response  Exposure to some secondhand smoke, worked in [employer]    Outpatient Encounter Medications as of 03/06/2024  Medication Sig   albuterol  (ACCUNEB ) 0.63 MG/3ML nebulizer solution Take 1 ampule by nebulization daily as needed (asthma).   albuterol  (VENTOLIN  HFA) 108 (90 Base) MCG/ACT inhaler Inhale 2 puffs into the lungs every 4 (four) hours as needed for wheezing or shortness of breath.   amLODipine  (NORVASC ) 10 MG tablet Take 10 mg by mouth daily.   aspirin  EC 81 MG tablet Take 81 mg by mouth daily. Swallow whole.   atorvastatin  (LIPITOR) 80 MG tablet Take 1 tablet (80 mg total) by mouth daily.   ezetimibe (ZETIA) 10 MG tablet Take 1 tablet (10 mg total) by mouth daily.   fluticasone  (FLONASE) 50 MCG/ACT nasal spray Place 2 sprays into both nostrils daily as needed for allergies.   Fluticasone -Umeclidin-Vilant (TRELEGY ELLIPTA ) 100-62.5-25 MCG/INH AEPB Inhale 1 puff into the lungs daily.   Fluticasone -Umeclidin-Vilant (TRELEGY ELLIPTA ) 200-62.5-25 MCG/ACT AEPB Inhale 1 puff into the lungs daily.   hydrochlorothiazide (HYDRODIURIL) 12.5 MG tablet Take 12.5 mg by mouth daily.   metoprolol  succinate (TOPROL -XL) 25 MG 24 hr tablet Take 1 tablet (25 mg total) by mouth at bedtime.   montelukast  (SINGULAIR ) 10 MG tablet Take 1 tablet (10 mg total) by mouth at bedtime.   Multiple Vitamins-Minerals (MULTIVITAMINS THER. W/MINERALS) TABS Take 1 tablet by  mouth daily.   OZEMPIC, 2 MG/DOSE, 8 MG/3ML SOPN Inject 2 mg into the skin once a week.   vitamin B-12 (CYANOCOBALAMIN ) 500 MCG tablet Take 500 mcg by mouth daily.   No facility-administered encounter medications on file as of 03/06/2024.    Allergies as of 03/06/2024   (No Known Allergies)    Past Medical History:  Diagnosis Date   Adrenal nodule (HCC)    Alcohol use    Allergic rhinitis    Aortic atherosclerosis (HCC)    Arthritis    RIGHT shoulder   Asthma    uses inhaler and nebulizer PRN   BMI 37.0-37.9, adult    CAD (coronary artery disease)    CHF (congestive heart failure) (HCC)    COPD (chronic obstructive pulmonary disease) (HCC)    GERD (gastroesophageal reflux disease)    diet controlled-OTC PRN   HTN (hypertension)    on meds   Hyperlipidemia    on meds   Hypertensive heart disease with CHF (congestive heart failure) (HCC)    Left ventricular hypertrophy    Obese    Peripheral vascular disease (HCC)    Sickle cell trait (HCC)    SVT (supraventricular tachycardia) (HCC)    Trapezius muscle strain    Urinary frequency    Vitamin D deficiency     Past Surgical History:  Procedure Laterality Date   RIGHT/LEFT HEART CATH AND CORONARY ANGIOGRAPHY N/A 02/27/2024   Procedure: RIGHT/LEFT HEART CATH AND CORONARY ANGIOGRAPHY;  Surgeon: Thukkani, Arun K, MD;  Location: [facility] INVASIVE CV  LAB;  Service: Cardiovascular;  Laterality: N/A;   TUBAL LIGATION      Family History  Problem Relation Age of Onset   Heart failure Sister    Diabetes Sister    Heart disease Sister        chf   Diabetes Mother    Hypertension Mother    Heart disease Mother        chf   Heart disease Father 62       MI   Hypertension Father    Heart disease Brother    Arthritis Sister    Heart disease Brother        Cabg   Colon polyps Brother 42   Stomach cancer Son 79   Colon cancer Neg Hx    Esophageal cancer Neg Hx    Rectal cancer Neg Hx     Social History   Socioeconomic  History   Marital status: Single    Spouse name: Not on file   Number of children: Not on file   Years of education: Not on file   Highest education level: Not on file  Occupational History   Not on file  Tobacco Use   Smoking status: Never   Smokeless tobacco: Never  Vaping Use   Vaping status: Never Used  Substance and Sexual Activity   Alcohol use: Yes    Alcohol/week: 3.0 standard drinks of alcohol    Types: 3 Standard drinks or equivalent per week    Comment: occationally, x3/week   Drug use: No   Sexual activity: Yes    Partners: Male    Birth control/protection: None  Other Topics Concern   Not on file  Social History Narrative   Not on file   Social Drivers of Health   Financial Resource Strain: Low Risk  (01/02/2024)   Received from [facility]   Overall Financial Resource Strain (CARDIA)    Difficulty of Paying Living Expenses: Not very hard  Food Insecurity: No Food Insecurity (02/27/2024)   Hunger Vital Sign    Worried About Running Out of Food in the Last Year: Never true    Ran Out of Food in the Last Year: Never true  Transportation Needs: No Transportation Needs (02/27/2024)   PRAPARE - Administrator, Civil Service (Medical): No    Lack of Transportation (Non-Medical): No  Physical Activity: Insufficiently Active (01/02/2024)   Received from [facility]   Exercise Vital Sign    Days of Exercise per Week: 3 days    Minutes of Exercise per Session: 20 min  Stress: No Stress Concern Present (01/02/2024)   Received from [facility] of Occupational Health - Occupational Stress Questionnaire    Feeling of Stress : Only a little  Social Connections: Moderately Isolated (02/27/2024)   Social Connection and Isolation Panel [NHANES]    Frequency of Communication with Friends and Family: More than three times a week    Frequency of Social Gatherings with Friends and Family: Twice a week    Attends Religious Services: More than 4  times per year    Active Member of [employer] or Organizations: No    Attends Banker Meetings: Never    Marital Status: Widowed  Intimate Partner Violence: Not At Risk (02/27/2024)   Humiliation, Afraid, Rape, and Kick questionnaire    Fear of Current or Ex-Partner: No    Emotionally Abused: No    Physically Abused: No    Sexually Abused:  No    Review of Systems  Respiratory:  Positive for shortness of breath.     Vitals:   03/06/24 1126  BP: 129/76  Pulse: 89  SpO2: 94%     Physical Exam Constitutional:      Appearance: Normal appearance.  HENT:     Head: Normocephalic.     Mouth/Throat:     Mouth: Mucous membranes are moist.  Eyes:     General: No scleral icterus. Cardiovascular:     Rate and Rhythm: Normal rate and regular rhythm.     Heart sounds: No murmur heard.    No friction rub.  Pulmonary:     Effort: No respiratory distress.     Breath sounds: No stridor. No wheezing or rhonchi.  Musculoskeletal:     Cervical back: No rigidity or tenderness.  Neurological:     Mental Status: She is alert.  Psychiatric:        Mood and Affect: Mood normal.     Data Reviewed: Previous pulmonary function test from 2013 reviewed  CT scan 11/10/2022 reviewed showing no significant lung findings, some scarring at the lingula  Assessment:  History of severe persistent asthma - Has been on Trelegy 100  GERD  Tracheomalacia  Plan/Recommendations: Change from Trelegy 100 to Trelegy 200  Continue using Trelegy once a day  Use albuterol  as needed Graded activities as tolerated  Follow-up in about 3 months  Schedule for pulmonary function test  Call with significant concerns   Myer Artis MD [place] [place] 03/06/2024, 9:33 PM  CC: Maryellen Snare, NP

## 2024-03-06 NOTE — Patient Instructions (Signed)
 I am sending in a prescription for Trelegy 200 to the pharmacy for you  1 puff a day as you are doing  Use albuterol  as needed  Graded exercises as tolerated will help  We will schedule you for breathing study  Tentative follow-up in about 3 months, we can try and see you on the day that you have the breathing study performed

## 2024-03-14 LAB — LIPID PANEL
Chol/HDL Ratio: 2.6 ratio (ref 0.0–4.4)
Cholesterol, Total: 130 mg/dL (ref 100–199)
HDL: 50 mg/dL (ref >39)
LDL Chol Calc (NIH): 59 mg/dL (ref 0–99)
Triglycerides: 118 mg/dL (ref 0–149)
VLDL Cholesterol Cal: 21 mg/dL (ref 5–40)

## 2024-03-14 LAB — HEPATIC FUNCTION PANEL
ALT: 9 IU/L (ref 0–32)
AST: 14 IU/L (ref 0–40)
Albumin: 4.2 g/dL (ref 3.8–4.8)
Alkaline Phosphatase: 87 IU/L (ref 44–121)
Bilirubin Total: 0.4 mg/dL (ref 0.0–1.2)
Bilirubin, Direct: 0.18 mg/dL (ref 0.00–0.40)
Total Protein: 6.5 g/dL (ref 6.0–8.5)

## 2024-03-20 NOTE — Progress Notes (Signed)
 Cardiology Office Note    Date:  03/25/2024  ID:  Diane Shaw, DOB [birth year], MRN [id number] PCP:  Campbell Reynolds, NP  Cardiologist:  Lurena MARLA Red, MD  Electrophysiologist:  None   Chief Complaint: post cath f/u  History of Present Illness: .    Diane Shaw is a 73 y.o. female with visit-pertinent history of CAD, HLD, brief PACs/PVCs/NSVT/SVT by prior outside monitor, aortic atherosclerosis, diastolic dysfunction, CKD 3a, anemia by labs, HTN, obesity, COPD, sickle cell trait, asthma, allergic rhinitis, cholelithiasis, adrenal nodule seen for post-hospital follow-up.   Monitor 06/2023 at [facility] showed 59-126bpm, avg 85bpm, 3 episodes NSVT (longest 6 beats), 2 episodes SVT (longest 11 beats), <1% PVCs, 3.6% PACs. Echo 12/2023 at [facility] showed EF 60-63%, diastolic dysfunction, mild LVH, RSVP normal, no significant valve disease. She was seen by Dr. Red in 01/2024 for dyspnea and started on Lasix  for diastolic dysfunction. Cor CTA was planned but in the interim she had it done at [facility] after a follow-up OV there. This was abnormal prompting cath done at [facility] 02/27/24 showing 70% D1, 30% pLAD, 20% pRCA, RHC felt reassuring. Lasix  was stopped as etiology did not appear to he CHF, did not help, and RHC was fine. Atorvastatin  was increased. Dr. Red recommended evaluation of noncardiac etiologies of symptoms. (AVS outlined that metoprolol  was changed, but she was on 25mg  PTA and med list was just updated to reflect discontinuation of the pre-CT dose.)  She returns for follow-up doing well overall. She continues to have dyspnea with exertion similar to pre-cath. She has since seen pulmonology who plans PFTs. She denies any chest pain, palpitations, dizziness or syncope.  Labwork independently reviewed: 02/2024 LDL 59, trig 118, LFTs ok, Mg 1.9, K 4.2, Cr 0.96, Hgb 10, plt 289 Per [facility] Note 01/2024: Recent blood tests from Jan, 2025 -  LDL 78, HDL 54, TG 138, TSH 1.97, BNP 45,  TC 156. HgbA1c 5.2%.Creat 1.02, GFR 58, Na 145, K 4.2, LFTs normal. Hgb 11.2 (Nl 11.7 -15.5), MCV 88, plats 354, WBC 11.1.   ROS: .    Please see the history of present illness.  All other systems are reviewed and otherwise negative.  Studies Reviewed: SABRA    EKG:  EKG is not ordered today but reviewed from 01/2024 - NSR with one PVC, nonspecific STTW changes, QTC  CV Studies: Cardiac studies reviewed are outlined and summarized above. Otherwise please see EMR for full report.   Current Reported Medications:.    Current Meds  Medication Sig   albuterol  (ACCUNEB ) 0.63 MG/3ML nebulizer solution Take 1 ampule by nebulization daily as needed (asthma).   albuterol  (VENTOLIN  HFA) 108 (90 Base) MCG/ACT inhaler Inhale 2 puffs into the lungs every 4 (four) hours as needed for wheezing or shortness of breath.   amLODipine  (NORVASC ) 10 MG tablet Take 10 mg by mouth daily.   aspirin  EC 81 MG tablet Take 81 mg by mouth daily. Swallow whole.   atorvastatin  (LIPITOR) 80 MG tablet Take 1 tablet (80 mg total) by mouth daily.   ezetimibe  (ZETIA ) 10 MG tablet Take 1 tablet (10 mg total) by mouth daily.   fluticasone  (FLONASE) 50 MCG/ACT nasal spray Place 2 sprays into both nostrils daily as needed for allergies.   Fluticasone -Umeclidin-Vilant (TRELEGY ELLIPTA ) 100-62.5-25 MCG/INH AEPB Inhale 1 puff into the lungs daily.   Fluticasone -Umeclidin-Vilant (TRELEGY ELLIPTA ) 200-62.5-25 MCG/ACT AEPB Inhale 1 puff into the lungs daily.   hydrochlorothiazide (HYDRODIURIL) 12.5 MG tablet Take 12.5 mg  by mouth daily.   metoprolol  succinate (TOPROL -XL) 25 MG 24 hr tablet Take 1 tablet (25 mg total) by mouth at bedtime.   montelukast  (SINGULAIR ) 10 MG tablet Take 1 tablet (10 mg total) by mouth at bedtime.   Multiple Vitamins-Minerals (MULTIVITAMINS THER. W/MINERALS) TABS Take 1 tablet by mouth daily.   OZEMPIC, 2 MG/DOSE, 8 MG/3ML SOPN Inject 2 mg into the skin once a week.   ranolazine (RANEXA) 500 MG 12 hr  tablet Take 500 mg by mouth 2 (two) times daily.   vitamin B-12 (CYANOCOBALAMIN ) 500 MCG tablet Take 500 mcg by mouth daily.    Physical Exam:    VS:  BP 120/66   Pulse 88   Ht 5' 5 (1.651 m)   Wt 198 lb (89.8 kg)   SpO2 96%   BMI 32.95 kg/m    Wt Readings from Last 3 Encounters:  03/25/24 198 lb (89.8 kg)  03/06/24 201 lb 3.2 oz (91.3 kg)  02/27/24 203 lb 14.4 oz (92.5 kg)    GEN: Well nourished, well developed in no acute distress NECK: No JVD; No carotid bruits CARDIAC: RRR, no murmurs, rubs, gallops RESPIRATORY:  Clear to auscultation without rales, wheezing or rhonchi  ABDOMEN: Soft, non-tender, non-distended EXTREMITIES:  No edema; No acute deformity. Right radial cath site without hematoma or ecchymosis; good pulse.  Asessement and Plan:.    1. Dyspnea - appears noncardiac by cardiac cath findings. RHC also argued against fluid overload, therefore Lasix  was stopped. Ranexa was previously empirically added by [facility]. Given the reassuring cardiac cath results, will stop this, as she reports she did not see a difference adding this medicine. Continue with pulmonology workup. She is also noted to be chronically anemic. I will f/u CBC with anemia panel today. Otherwise encouraged f/u with PCP for this.  2. Anemia, unspecified - labs planned as above. Denies bleeding. Has h/o sickle cell trait.  3. CAD, HLD - continue with medical management with ASA 81mg  daily, amlodipine  10mg  daily, Toprol  25mg  nightly, atorvastatin  80mg  daily, Zetia  10mg  daily. She had recent repeat lipids already with LDL 59. Will be due for f/u LFTs in about 3 months. She indicates she will be having physical with PCP around that time so can be obtained with routine labs with primary care.  4. Diastolic dysfunction - appears euvolemic. OK to continue hydrochlorothiazide as ordered, no longer on Lasix  as above.  5. NSVT/PSVT - quiescent by symptoms. Low burden on prior monitor. Echo 12/2023 reassuring with  only mild LVH. Follow symptom burden on Toprol  25mg  daily.    Disposition: F/u with me in 6 months. Continue f/u with pulm as advised.  Signed, Keali Mccraw N Deaglan Lile, PA-C

## 2024-03-25 ENCOUNTER — Encounter: Payer: Self-pay | Admitting: Physician Assistant

## 2024-03-25 ENCOUNTER — Ambulatory Visit: Attending: Physician Assistant | Admitting: Physician Assistant

## 2024-03-25 VITALS — BP 120/66 | HR 88 | Ht 65.0 in | Wt 198.0 lb

## 2024-03-25 DIAGNOSIS — E785 Hyperlipidemia, unspecified: Secondary | ICD-10-CM | POA: Diagnosis not present

## 2024-03-25 DIAGNOSIS — I2510 Atherosclerotic heart disease of native coronary artery without angina pectoris: Secondary | ICD-10-CM | POA: Diagnosis not present

## 2024-03-25 DIAGNOSIS — I471 Supraventricular tachycardia, unspecified: Secondary | ICD-10-CM

## 2024-03-25 DIAGNOSIS — R0600 Dyspnea, unspecified: Secondary | ICD-10-CM

## 2024-03-25 DIAGNOSIS — I5189 Other ill-defined heart diseases: Secondary | ICD-10-CM | POA: Diagnosis not present

## 2024-03-25 DIAGNOSIS — I4729 Other ventricular tachycardia: Secondary | ICD-10-CM

## 2024-03-25 DIAGNOSIS — D649 Anemia, unspecified: Secondary | ICD-10-CM

## 2024-03-25 NOTE — Patient Instructions (Signed)
 Medication Instructions:  STOP RANEXA *If you need a refill on your cardiac medications before your next appointment, please call your pharmacy*  Lab Work: CBC+ANEMIA PANEL TODAY If you have labs (blood work) drawn today and your tests are completely normal, you will receive your results only by: MyChart Message (if you have MyChart) OR A paper copy in the mail If you have any lab test that is abnormal or we need to change your treatment, we will call you to review the results.  Testing/Procedures: NO TESTING  Follow-Up: At [facility], you and your health needs are our priority.  As part of our continuing mission to provide you with exceptional heart care, our providers are all part of one team.  This team includes your primary Cardiologist (physician) and Advanced Practice Providers or APPs (Physician Assistants and Nurse Practitioners) who all work together to provide you with the care you need, when you need it.  Your next appointment:   6 month(s)  Provider:   Dayna Dunn, PA-C

## 2024-03-26 ENCOUNTER — Ambulatory Visit: Payer: Self-pay | Admitting: Physician Assistant

## 2024-03-26 LAB — IRON AND TIBC
Iron Saturation: 17 (ref 15–55)
Iron: 52 ug/dL (ref 27–139)
Total Iron Binding Capacity: 305 ug/dL (ref 250–450)
UIBC: 253 ug/dL (ref 118–369)

## 2024-03-26 LAB — CBC
Hematocrit: 31 % — ABNORMAL LOW (ref 34.0–46.6)
Hemoglobin: 9.8 g/dL — ABNORMAL LOW (ref 11.1–15.9)
MCH: 28.1 pg (ref 26.6–33.0)
MCHC: 31.6 g/dL (ref 31.5–35.7)
MCV: 89 fL (ref 79–97)
Platelets: 309 10*3/uL (ref 150–450)
RBC: 3.49 x10E6/uL — ABNORMAL LOW (ref 3.77–5.28)
RDW: 13.1 % (ref 11.7–15.4)
WBC: 9.6 10*3/uL (ref 3.4–10.8)

## 2024-03-26 LAB — FOLATE: Folate: >20 ng/mL (ref >3.0)

## 2024-03-26 LAB — VITAMIN B12: Vitamin B-12: 655 pg/mL (ref 232–1245)

## 2024-03-26 LAB — FERRITIN: Ferritin: 196 ng/mL — AB (ref 15–150)

## 2024-04-12 ENCOUNTER — Encounter

## 2024-05-02 ENCOUNTER — Ambulatory Visit: Admitting: Pulmonary Disease

## 2024-05-02 DIAGNOSIS — R0602 Shortness of breath: Secondary | ICD-10-CM | POA: Diagnosis not present

## 2024-05-02 DIAGNOSIS — J4540 Moderate persistent asthma, uncomplicated: Secondary | ICD-10-CM

## 2024-05-02 LAB — PULMONARY FUNCTION TEST
DL/VA % pred: 96 %
DL/VA: 3.93 ml/min/mmHg/L
DLCO cor % pred: 80 %
DLCO cor: 16.04 ml/min/mmHg
DLCO unc % pred: 70 %
DLCO unc: 13.93 ml/min/mmHg
FEF 25-75 Post: 0.98 L/s
FEF 25-75 Pre: 0.54 L/s
FEF2575-%Change-Post: 80 %
FEF2575-%Pred-Post: 55 %
FEF2575-%Pred-Pre: 30 %
FEV1-%Change-Post: 23 %
FEV1-%Pred-Post: 61 %
FEV1-%Pred-Pre: 49 %
FEV1-Post: 1.38 L
FEV1-Pre: 1.12 L
FEV1FVC-%Change-Post: 7 %
FEV1FVC-%Pred-Pre: 77 %
FEV6-%Change-Post: 15 %
FEV6-%Pred-Post: 76 %
FEV6-%Pred-Pre: 66 %
FEV6-Post: 2.16 L
FEV6-Pre: 1.87 L
FEV6FVC-%Change-Post: 0 %
FEV6FVC-%Pred-Post: 103 %
FEV6FVC-%Pred-Pre: 102 %
FVC-%Change-Post: 15 %
FVC-%Pred-Post: 73 %
FVC-%Pred-Pre: 64 %
FVC-Post: 2.2 L
FVC-Pre: 1.91 L
Post FEV1/FVC ratio: 63 %
Post FEV6/FVC ratio: 98 %
Pre FEV1/FVC ratio: 58 %
Pre FEV6/FVC Ratio: 98 %
RV % pred: 225 %
RV: 5.23 L
TLC % pred: 146 %
TLC: 7.61 L

## 2024-05-02 NOTE — Progress Notes (Signed)
 Full pft performed today.

## 2024-05-02 NOTE — Patient Instructions (Signed)
 Full pft performed today.

## 2024-05-30 NOTE — Progress Notes (Addendum)
 Cardiology Office Note:   Date:  06/04/2024  ID:  Diane Shaw, DOB [birth year], MRN [id number] PCP:  Campbell Reynolds, NP  [facility] Providers Cardiologist:  Wendel Haws, MD Referring MD: Campbell Reynolds, NP  Chief Complaint/Reason for Referral: Dyspnea ASSESSMENT:    1. Dyspnea, unspecified type   2. Diastolic dysfunction   3. CAD in native artery   4. Hyperlipidemia LDL goal <70   5. Aortic atherosclerosis (HCC)   6. Stage 3a chronic kidney disease (HCC)   7. Palpitations   8. Primary hypertension   9. BMI 33.0-33.9,adult      PLAN:   In order of problems listed above: Dyspnea: Reassuring coronary angiography and right heart catheterization.  Echocardiogram demonstrates mild concentric hypertrophy with no significant valvular abnormalities.  Patient being seen by pulmonary.  Intensification of her therapy for asthma has resulted in improved symptoms. Diastolic dysfunction: Right heart catheterization with wedge pressure mean of 5 mmHg and LVEDP of 4 to 5 mmHg.  I do not think her dyspnea is from diastolic dysfunction.  Continue Toprol  25 mg, start Jardiance  10 mg and losartan  25 mg; check BMP next week CAD: Mild nonobstructive disease; continue aspirin  81 mg, atorvastatin  80 mg. Hyperlipidemia: LDL was 78 in January.  Goal LDL goal less than 70.   Aortic atherosclerosis: Continue aspirin  81 mg and atorvastatin  80.  Check lipids, lfts, LP(a) next week CKD stage IIIa: Start Jardiance  10 mg daily and losartan  25 mg for renal protection Palpitations: Reassuring monitor; continue Toprol  25 mg daily. Hypertension: Continue hydrochlorothiazide 12.5 mg a day Toprol  25 mg daily; stop amlodipine  and start losartan  25 mg; BP well-controlled today. Elevated BMI: Continue Ozempic 2mg  weekly. Prediabetes:  Continue current therapy.   I spent 35 minutes reviewing all clinical data during and prior to this visit including all relevant imaging studies, laboratories, clinical information  from other health systems and prior notes from both Cardiology and other specialties, interviewing the patient, conducting a complete physical examination, and coordinating care in order to formulate a comprehensive and personalized evaluation and treatment plan.            Dispo:  Return in about 6 months (around 12/02/2024).      Medication Adjustments/Labs and Tests Ordered: Current medicines are reviewed at length with the patient today.  Concerns regarding medicines are outlined above.  The following changes have been made:     Labs/tests ordered: Orders Placed This Encounter  Procedures   Lipoprotein A (LPA)   Lipid panel   Comprehensive metabolic panel with GFR    Medication Changes: Meds ordered this encounter  Medications   empagliflozin  (JARDIANCE ) 10 MG TABS tablet    Sig: Take 1 tablet (10 mg total) by mouth daily before breakfast.    Dispense:  30 tablet    Refill:  3   losartan  (COZAAR ) 25 MG tablet    Sig: Take 1 tablet (25 mg total) by mouth daily.    Dispense:  30 tablet    Refill:  3   empagliflozin  (JARDIANCE ) 10 MG TABS tablet    Sig: Take 1 tablet (10 mg total) by mouth daily before breakfast.    Dispense:  28 tablet    Refill:  0    Lot Number?:   [id number]    Expiration Date?:   12/24/2025    Quantity:   28             tablets    Current medicines are reviewed at length with the  patient today.  The patient does not have concerns regarding medicines.     History of Present Illness:      FOCUSED PROBLEM LIST:   Asthma/COPD Diastolic dysfunction Mild LVH, EF 55 to 60% TTE 2025 OSH SVT 2 episodes; 4 beats monitor 2024 OSH VT 3 episodes; longest lasting 6 beats monitored 2024 OSH Hyperlipidemia Aortic atherosclerosis Chest CT 2024 CAD 25 to 49% mid RCA lesion not amenable to FFR, negative FFR LAD, left circumflex, diagonal coronary CTA [facility] May 2025 70% D1, 30% pLAD, 20% proximal RCA June 2025 Diastolic dysfunction Mild LVH, no  significant valve issues, EF 60 to 65% TTE [facility] April 2025 CKD stage IIIa BMI 33 Prediabetic  May 2025:  Patient consents to use of AI scribe. Patient is a 73 year old female with above listed medical problems referred for recommendations regarding dyspnea.  She experiences shortness of breath during normal activities such as walking short distances or performing household tasks, requiring frequent rest due to breathlessness. This issue has persisted for about a year. No shortness of breath while sitting or lying flat, and no chest pain. She sometimes wheezes when short of breath.  Her asthma medication was recently changed from Trelegy to Vertrix, taken daily. She uses her nebulizer at night and her emergency inhaler frequently, which provide some relief. She has a history of asthma and COPD, using a nebulizer and an emergency inhaler.  She reports a weight loss of 30 pounds, initially hoping it would improve her breathing, but it has not led to significant changes. No swelling in her legs, blacking out spells, or palpitations. She does not have a staircase at home, limiting her exertion to walking short distances.  Her past medical history includes asthma, COPD, and a previous CT scan showing cholesterol deposits on the aorta. Extra heartbeats were noted from a monitor in 2024, but no follow-up was done at that time. She is currently on a baby aspirin .  Plan: Refer for coronary CTA.  September 2025:  Patient consents to use of AI scribe. In the interim the patient had a coronary CTA which demonstrated potential high-grade mid RCA lesion.  She was referred for coronary angiography and right heart catheterization.  This demonstrated only mild to moderate nonobstructive disease.  Her right heart catheterization demonstrated relatively normal filling pressures with a PVR of 1.5 Woods units.  She has followed up with her asthma doctor, who adjusted her medication to a stronger dosage, resulting in  some improvement in her symptoms. She experiences some wheezing, although it has improved. No chest discomfort, lightheadedness, syncope, leg edema, or orthopnea.        Current Medications: Current Meds  Medication Sig   albuterol  (ACCUNEB ) 0.63 MG/3ML nebulizer solution Take 1 ampule by nebulization daily as needed (asthma).   albuterol  (VENTOLIN  HFA) 108 (90 Base) MCG/ACT inhaler Inhale 2 puffs into the lungs every 4 (four) hours as needed for wheezing or shortness of breath.   aspirin  EC 81 MG tablet Take 81 mg by mouth daily. Swallow whole.   atorvastatin  (LIPITOR) 80 MG tablet Take 1 tablet (80 mg total) by mouth daily.   empagliflozin  (JARDIANCE ) 10 MG TABS tablet Take 1 tablet (10 mg total) by mouth daily before breakfast.   empagliflozin  (JARDIANCE ) 10 MG TABS tablet Take 1 tablet (10 mg total) by mouth daily before breakfast.   ezetimibe  (ZETIA ) 10 MG tablet Take 1 tablet (10 mg total) by mouth daily.   fluticasone  (FLONASE) 50 MCG/ACT nasal spray Place 2 sprays into  both nostrils daily as needed for allergies.   Fluticasone -Umeclidin-Vilant (TRELEGY ELLIPTA ) 100-62.5-25 MCG/INH AEPB Inhale 1 puff into the lungs daily.   Fluticasone -Umeclidin-Vilant (TRELEGY ELLIPTA ) 200-62.5-25 MCG/ACT AEPB Inhale 1 puff into the lungs daily.   hydrochlorothiazide (HYDRODIURIL) 12.5 MG tablet Take 12.5 mg by mouth daily.   losartan  (COZAAR ) 25 MG tablet Take 1 tablet (25 mg total) by mouth daily.   metFORMIN (GLUCOPHAGE-XR) 500 MG 24 hr tablet Take 500 mg by mouth daily.   metoprolol  succinate (TOPROL -XL) 25 MG 24 hr tablet Take 1 tablet (25 mg total) by mouth at bedtime.   montelukast  (SINGULAIR ) 10 MG tablet Take 1 tablet (10 mg total) by mouth at bedtime.   Multiple Vitamins-Minerals (MULTIVITAMINS THER. W/MINERALS) TABS Take 1 tablet by mouth daily.   OZEMPIC, 2 MG/DOSE, 8 MG/3ML SOPN Inject 2 mg into the skin once a week.   vitamin B-12 (CYANOCOBALAMIN ) 500 MCG tablet Take 500 mcg by mouth  daily.   [DISCONTINUED] amLODipine  (NORVASC ) 10 MG tablet Take 10 mg by mouth daily.     Review of Systems:   Please see the history of present illness.    All other systems reviewed and are negative.     EKGs/Labs/Other Test Reviewed:   EKG: 2025 sinus rhythm with PVCs  EKG Interpretation Date/Time:    Ventricular Rate:    PR Interval:    QRS Duration:    QT Interval:    QTC Calculation:   R Axis:      Text Interpretation:           Risk Assessment/Calculations:          Physical Exam:   VS:  BP 126/64   Pulse 99   Ht 5' 5 (1.651 m)   Wt 197 lb 9.6 oz (89.6 kg)   SpO2 97%   BMI 32.88 kg/m        Wt Readings from Last 3 Encounters:  06/04/24 197 lb 9.6 oz (89.6 kg)  05/31/24 201 lb 3.2 oz (91.3 kg)  03/25/24 198 lb (89.8 kg)      GENERAL:  No apparent distress, AOx3 HEENT:  No carotid bruits, +2 carotid impulses, no scleral icterus CAR: RRR no murmurs, gallops, rubs, or thrills RES:  Clear to auscultation bilaterally ABD:  Soft, nontender, nondistended, positive bowel sounds x 4 VASC:  +2 radial pulses, +2 carotid pulses NEURO:  CN 2-12 grossly intact; motor and sensory grossly intact PSYCH:  No active depression or anxiety EXT:  No edema, ecchymosis, or cyanosis  Signed, Idalis Hoelting K Tashanti Dalporto, MD  06/04/2024 3:18 PM    [facility] [address] [place], [place], [place]  [address] Phone: [phone]; Fax: [phone]   Note:  This document was prepared using Dragon voice recognition software and may include unintentional dictation errors.

## 2024-05-31 ENCOUNTER — Ambulatory Visit: Admitting: Pulmonary Disease

## 2024-05-31 VITALS — BP 117/68 | HR 85 | Ht 68.0 in | Wt 201.2 lb

## 2024-05-31 DIAGNOSIS — J4540 Moderate persistent asthma, uncomplicated: Secondary | ICD-10-CM

## 2024-05-31 NOTE — Patient Instructions (Signed)
 Your breathing study showed significant improvement with inhaler use  Continue using Trelegy  Continue using albuterol  as needed  Try and get back in the gym to exercise on a regular basis  You can make significant gains with regular exercises  Call us  with significant concerns  Follow-up in about 6 months

## 2024-05-31 NOTE — Progress Notes (Signed)
 Diane Shaw    [id number]    [birth year]  Primary Care Physician:Stowe, Damian, NP  Referring Physician: Campbell Damian, NP [address] [place],  [place] [address]  Chief complaint:    Patient being seen for shortness of breath on activity  HPI:  Shortness of breath with activity -Feels her breathing is a little bit better with Trelegy  Compliant with Trelegy, This was increased from Trelegy 100 to Trelegy 200  Background history of asthma  Between the last time she was here and presently had a pulmonary function test performed which was reviewed with the patient today  Never smoker, some of the answer most difficult for her  Previous PFT from 2013 with moderate obstruction with no significant bronchodilator response  Exposure to some secondhand smoke, worked in [employer]   Outpatient Encounter Medications as of 05/31/2024  Medication Sig   albuterol  (ACCUNEB ) 0.63 MG/3ML nebulizer solution Take 1 ampule by nebulization daily as needed (asthma).   albuterol  (VENTOLIN  HFA) 108 (90 Base) MCG/ACT inhaler Inhale 2 puffs into the lungs every 4 (four) hours as needed for wheezing or shortness of breath.   amLODipine  (NORVASC ) 10 MG tablet Take 10 mg by mouth daily.   aspirin  EC 81 MG tablet Take 81 mg by mouth daily. Swallow whole.   atorvastatin  (LIPITOR) 80 MG tablet Take 1 tablet (80 mg total) by mouth daily.   ezetimibe  (ZETIA ) 10 MG tablet Take 1 tablet (10 mg total) by mouth daily.   fluticasone  (FLONASE) 50 MCG/ACT nasal spray Place 2 sprays into both nostrils daily as needed for allergies.   Fluticasone -Umeclidin-Vilant (TRELEGY ELLIPTA ) 100-62.5-25 MCG/INH AEPB Inhale 1 puff into the lungs daily.   Fluticasone -Umeclidin-Vilant (TRELEGY ELLIPTA ) 200-62.5-25 MCG/ACT AEPB Inhale 1 puff into the lungs daily.   hydrochlorothiazide (HYDRODIURIL) 12.5 MG tablet Take 12.5 mg by mouth daily.   metoprolol  succinate (TOPROL -XL) 25 MG 24 hr tablet Take 1 tablet  (25 mg total) by mouth at bedtime.   montelukast  (SINGULAIR ) 10 MG tablet Take 1 tablet (10 mg total) by mouth at bedtime.   Multiple Vitamins-Minerals (MULTIVITAMINS THER. W/MINERALS) TABS Take 1 tablet by mouth daily.   OZEMPIC, 2 MG/DOSE, 8 MG/3ML SOPN Inject 2 mg into the skin once a week.   vitamin B-12 (CYANOCOBALAMIN ) 500 MCG tablet Take 500 mcg by mouth daily.   No facility-administered encounter medications on file as of 05/31/2024.    Allergies as of 05/31/2024   (No Known Allergies)    Past Medical History:  Diagnosis Date   Adrenal nodule (HCC)    Alcohol use    Allergic rhinitis    Aortic atherosclerosis (HCC)    Arthritis    RIGHT shoulder   Asthma    uses inhaler and nebulizer PRN   BMI 37.0-37.9, adult    CAD (coronary artery disease)    CHF (congestive heart failure) (HCC)    COPD (chronic obstructive pulmonary disease) (HCC)    GERD (gastroesophageal reflux disease)    diet controlled-OTC PRN   HTN (hypertension)    on meds   Hyperlipidemia    on meds   Hypertensive heart disease with CHF (congestive heart failure) (HCC)    Left ventricular hypertrophy    Obese    Peripheral vascular disease (HCC)    Sickle cell trait (HCC)    SVT (supraventricular tachycardia) (HCC)    Trapezius muscle strain    Urinary frequency    Vitamin D deficiency  Past Surgical History:  Procedure Laterality Date   RIGHT/LEFT HEART CATH AND CORONARY ANGIOGRAPHY N/A 02/27/2024   Procedure: RIGHT/LEFT HEART CATH AND CORONARY ANGIOGRAPHY;  Surgeon: Wendel Lurena POUR, MD;  Location: [facility];  Service: Cardiovascular;  Laterality: N/A;   TUBAL LIGATION      Family History  Problem Relation Age of Onset   Heart failure Sister    Diabetes Sister    Heart disease Sister        chf   Diabetes Mother    Hypertension Mother    Heart disease Mother        chf   Heart disease Father 24       MI   Hypertension Father    Heart disease Brother    Arthritis Sister     Heart disease Brother        Cabg   Colon polyps Brother 66   Stomach cancer Son 19   Colon cancer Neg Hx    Esophageal cancer Neg Hx    Rectal cancer Neg Hx     Social History   Socioeconomic History   Marital status: Single    Spouse name: Not on file   Number of children: Not on file   Years of education: Not on file   Highest education level: Not on file  Occupational History   Not on file  Tobacco Use   Smoking status: Never   Smokeless tobacco: Never  Vaping Use   Vaping status: Never Used  Substance and Sexual Activity   Alcohol use: Yes    Alcohol/week: 3.0 standard drinks of alcohol    Types: 3 Standard drinks or equivalent per week    Comment: occationally, x3/week   Drug use: No   Sexual activity: Yes    Partners: Male    Birth control/protection: None  Other Topics Concern   Not on file  Social History Narrative   Not on file   Social Drivers of Health   Financial Resource Strain: Low Risk  (01/02/2024)   Received from [facility]   Overall Financial Resource Strain (CARDIA)    Difficulty of Paying Living Expenses: Not very hard  Food Insecurity: No Food Insecurity (02/27/2024)   Hunger Vital Sign    Worried About Running Out of Food in the Last Year: Never true    Ran Out of Food in the Last Year: Never true  Transportation Needs: No Transportation Needs (02/27/2024)   PRAPARE - Administrator, Civil Service (Medical): No    Lack of Transportation (Non-Medical): No  Physical Activity: Insufficiently Active (01/02/2024)   Received from [facility]   Exercise Vital Sign    On average, how many days per week do you engage in moderate to strenuous exercise (like a brisk walk)?: 3 days    On average, how many minutes do you engage in exercise at this level?: 20 min  Stress: No Stress Concern Present (01/02/2024)   Received from [facility] - Occupational Stress Questionnaire    Feeling of Stress :  Only a little  Social Connections: Moderately Isolated (02/27/2024)   Social Connection and Isolation Panel    Frequency of Communication with Friends and Family: More than three times a week    Frequency of Social Gatherings with Friends and Family: Twice a week    Attends Religious Services: More than 4 times per year    Active Member of [employer] or Organizations:  No    Attends Banker Meetings: Never    Marital Status: Widowed  Intimate Partner Violence: Not At Risk (02/27/2024)   Humiliation, Afraid, Rape, and Kick questionnaire    Fear of Current or Ex-Partner: No    Emotionally Abused: No    Physically Abused: No    Sexually Abused: No    Review of Systems  Respiratory:  Positive for shortness of breath.     Vitals:   05/31/24 1109  BP: 117/68  Pulse: 85  SpO2: 99%     Physical Exam Constitutional:      Appearance: Normal appearance.  HENT:     Head: Normocephalic.     Mouth/Throat:     Mouth: Mucous membranes are moist.  Eyes:     General: No scleral icterus. Cardiovascular:     Rate and Rhythm: Normal rate and regular rhythm.     Heart sounds: No murmur heard.    No friction rub.  Pulmonary:     Effort: No respiratory distress.     Breath sounds: No stridor. No wheezing or rhonchi.  Musculoskeletal:     Cervical back: No rigidity or tenderness.  Neurological:     Mental Status: She is alert.  Psychiatric:        Mood and Affect: Mood normal.    Data Reviewed: Previous pulmonary function test from 2013 reviewed  CT scan 11/10/2022 reviewed showing no significant lung findings, some scarring at the lingula  Pulmonary function test was reviewed with the patient showing severe obstruction with significant bronchodilator response, hyperinflation with mildly reduced diffusing capacity - Was reviewed with the patient  Assessment:  History of severe persistent asthma - Has been on Trelegy 100 - Was recently increased to Trelegy 200 which seems to  be helping  GERD  Tracheomalacia  Plan/Recommendations: Continue Trelegy 200  Graded exercise as tolerated  Use albuterol  as needed  Encouraged to call with significant concerns  Follow-up in about 6 months  I spent 32 minutes dedicated to the care of this patient on the date of this encounter to include previsit review of records, face-to-face time with the patient discussing conditions above, post visit ordering of testing,ordering medications,independentlyinterpreting results, clinical documentation with electronic health record and communicated necessary findings to members of the patient's care team  Jennet Epley MD [facility] 05/31/2024, 11:48 AM  CC: Campbell Reynolds, NP

## 2024-06-04 ENCOUNTER — Encounter: Payer: Self-pay | Admitting: Internal Medicine

## 2024-06-04 ENCOUNTER — Ambulatory Visit: Attending: Cardiovascular Disease | Admitting: Internal Medicine

## 2024-06-04 ENCOUNTER — Telehealth: Payer: Self-pay

## 2024-06-04 VITALS — BP 126/64 | HR 99 | Ht 65.0 in | Wt 197.6 lb

## 2024-06-04 DIAGNOSIS — R0600 Dyspnea, unspecified: Secondary | ICD-10-CM | POA: Diagnosis not present

## 2024-06-04 DIAGNOSIS — E785 Hyperlipidemia, unspecified: Secondary | ICD-10-CM

## 2024-06-04 DIAGNOSIS — I5189 Other ill-defined heart diseases: Secondary | ICD-10-CM

## 2024-06-04 DIAGNOSIS — I10 Essential (primary) hypertension: Secondary | ICD-10-CM

## 2024-06-04 DIAGNOSIS — Z6833 Body mass index (BMI) 33.0-33.9, adult: Secondary | ICD-10-CM

## 2024-06-04 DIAGNOSIS — R002 Palpitations: Secondary | ICD-10-CM

## 2024-06-04 DIAGNOSIS — I2510 Atherosclerotic heart disease of native coronary artery without angina pectoris: Secondary | ICD-10-CM | POA: Diagnosis not present

## 2024-06-04 DIAGNOSIS — N1831 Chronic kidney disease, stage 3a: Secondary | ICD-10-CM

## 2024-06-04 DIAGNOSIS — I700 Atherosclerosis of aorta: Secondary | ICD-10-CM

## 2024-06-04 MED ORDER — EMPAGLIFLOZIN 10 MG PO TABS: 10.0000 mg | ORAL_TABLET | Freq: Every day | ORAL | 3 refills | Status: AC

## 2024-06-04 MED ORDER — LOSARTAN POTASSIUM 25 MG PO TABS: 25.0000 mg | ORAL_TABLET | Freq: Every day | ORAL | 3 refills | Status: DC

## 2024-06-04 MED ORDER — EMPAGLIFLOZIN 10 MG PO TABS
10.0000 mg | ORAL_TABLET | Freq: Every day | ORAL | 0 refills | Status: DC
Start: 2024-06-04 — End: 2024-08-17

## 2024-06-04 NOTE — Patient Instructions (Signed)
 Medication Instructions:  STOP Amlodipine   START Losartan  25 mg once daily   START Jardiance  10 mg once daily   *If you need a refill on your cardiac medications before your next appointment, please call your pharmacy*  Lab Work: To be completed in 1 week: lipid panel, CMP, and lipoprotein a  If you have labs (blood work) drawn today and your tests are completely normal, you will receive your results only by: MyChart Message (if you have MyChart) OR A paper copy in the mail If you have any lab test that is abnormal or we need to change your treatment, we will call you to review the results.  Testing/Procedures: None ordered today.  Follow-Up: At [facility], you and your health needs are our priority.  As part of our continuing mission to provide you with exceptional heart care, our providers are all part of one team.  This team includes your primary Cardiologist (physician) and Advanced Practice Providers or APPs (Physician Assistants and Nurse Practitioners) who all work together to provide you with the care you need, when you need it.  Your next appointment:   6 month(s)  Provider:   Glendia Ferrier, PA-C

## 2024-06-04 NOTE — Telephone Encounter (Signed)
.  hcsamples

## 2024-06-06 NOTE — Telephone Encounter (Signed)
 Provider nurse handled samples.

## 2024-06-21 ENCOUNTER — Ambulatory Visit: Payer: Self-pay | Admitting: Internal Medicine

## 2024-06-21 DIAGNOSIS — E785 Hyperlipidemia, unspecified: Secondary | ICD-10-CM

## 2024-06-21 LAB — COMPREHENSIVE METABOLIC PANEL WITH GFR
ALT: 10 IU/L (ref 0–32)
AST: 16 IU/L (ref 0–40)
Albumin: 4.4 g/dL (ref 3.8–4.8)
Alkaline Phosphatase: 91 IU/L (ref 49–135)
BUN/Creatinine Ratio: 17 (ref 12–28)
BUN: 17 mg/dL (ref 8–27)
Bilirubin Total: 0.3 mg/dL (ref 0.0–1.2)
CO2: 17 mmol/L — ABNORMAL LOW (ref 20–29)
Calcium: 9.2 mg/dL (ref 8.7–10.3)
Chloride: 105 mmol/L (ref 96–106)
Creatinine, Ser: 1 mg/dL (ref 0.57–1.00)
Globulin, Total: 2.4 g/dL (ref 1.5–4.5)
Glucose: 69 mg/dL — ABNORMAL LOW (ref 70–99)
Potassium: 4.5 mmol/L (ref 3.5–5.2)
Sodium: 144 mmol/L (ref 134–144)
Total Protein: 6.8 g/dL (ref 6.0–8.5)
eGFR: 59 mL/min/1.73 — ABNORMAL LOW (ref >59)

## 2024-06-21 LAB — LIPID PANEL
Chol/HDL Ratio: 2.8 ratio (ref 0.0–4.4)
Cholesterol, Total: 126 mg/dL (ref 100–199)
HDL: 45 mg/dL (ref >39)
LDL Chol Calc (NIH): 63 mg/dL (ref 0–99)
Triglycerides: 97 mg/dL (ref 0–149)
VLDL Cholesterol Cal: 18 mg/dL (ref 5–40)

## 2024-06-21 LAB — LIPOPROTEIN A (LPA): Lipoprotein (a): 75.1 nmol/L — AB (ref <75.0)

## 2024-06-21 MED ORDER — EZETIMIBE 10 MG PO TABS: 10.0000 mg | ORAL_TABLET | Freq: Every day | ORAL | 3 refills | Status: AC

## 2024-06-27 ENCOUNTER — Other Ambulatory Visit: Payer: Self-pay | Admitting: Student

## 2024-07-05 ENCOUNTER — Other Ambulatory Visit: Payer: Self-pay | Admitting: Student

## 2024-07-29 ENCOUNTER — Encounter: Payer: Self-pay | Admitting: Radiology

## 2024-08-17 ENCOUNTER — Emergency Department (HOSPITAL_COMMUNITY)

## 2024-08-17 ENCOUNTER — Encounter (HOSPITAL_COMMUNITY): Payer: Self-pay | Admitting: Internal Medicine

## 2024-08-17 ENCOUNTER — Inpatient Hospital Stay (HOSPITAL_COMMUNITY)
Admission: EM | Admit: 2024-08-17 | Discharge: 2024-08-19 | DRG: 419 | Disposition: A | Discharge status: Home / Self Care | Attending: Internal Medicine | Admitting: Internal Medicine

## 2024-08-17 ENCOUNTER — Inpatient Hospital Stay (HOSPITAL_COMMUNITY)

## 2024-08-17 ENCOUNTER — Other Ambulatory Visit: Payer: Self-pay

## 2024-08-17 DIAGNOSIS — I10 Essential (primary) hypertension: Secondary | ICD-10-CM | POA: Diagnosis present

## 2024-08-17 DIAGNOSIS — Z800 Family history of malignant neoplasm of digestive organs: Secondary | ICD-10-CM

## 2024-08-17 DIAGNOSIS — E66811 Obesity, class 1: Secondary | ICD-10-CM | POA: Diagnosis present

## 2024-08-17 DIAGNOSIS — Z7985 Long-term (current) use of injectable non-insulin antidiabetic drugs: Secondary | ICD-10-CM

## 2024-08-17 DIAGNOSIS — Z8261 Family history of arthritis: Secondary | ICD-10-CM

## 2024-08-17 DIAGNOSIS — Z6833 Body mass index (BMI) 33.0-33.9, adult: Secondary | ICD-10-CM

## 2024-08-17 DIAGNOSIS — J4520 Mild intermittent asthma, uncomplicated: Secondary | ICD-10-CM | POA: Diagnosis present

## 2024-08-17 DIAGNOSIS — K8050 Calculus of bile duct without cholangitis or cholecystitis without obstruction: Secondary | ICD-10-CM

## 2024-08-17 DIAGNOSIS — S22080D Wedge compression fracture of T11-T12 vertebra, subsequent encounter for fracture with routine healing: Secondary | ICD-10-CM

## 2024-08-17 DIAGNOSIS — R1011 Right upper quadrant pain: Principal | ICD-10-CM

## 2024-08-17 DIAGNOSIS — R0789 Other chest pain: Secondary | ICD-10-CM

## 2024-08-17 DIAGNOSIS — Z833 Family history of diabetes mellitus: Secondary | ICD-10-CM

## 2024-08-17 DIAGNOSIS — E785 Hyperlipidemia, unspecified: Secondary | ICD-10-CM | POA: Diagnosis present

## 2024-08-17 DIAGNOSIS — K219 Gastro-esophageal reflux disease without esophagitis: Secondary | ICD-10-CM | POA: Diagnosis present

## 2024-08-17 DIAGNOSIS — K838 Other specified diseases of biliary tract: Secondary | ICD-10-CM

## 2024-08-17 DIAGNOSIS — Z8249 Family history of ischemic heart disease and other diseases of the circulatory system: Secondary | ICD-10-CM

## 2024-08-17 DIAGNOSIS — Z794 Long term (current) use of insulin: Secondary | ICD-10-CM

## 2024-08-17 DIAGNOSIS — Z7982 Long term (current) use of aspirin: Secondary | ICD-10-CM

## 2024-08-17 DIAGNOSIS — Z7984 Long term (current) use of oral hypoglycemic drugs: Secondary | ICD-10-CM

## 2024-08-17 DIAGNOSIS — E1151 Type 2 diabetes mellitus with diabetic peripheral angiopathy without gangrene: Secondary | ICD-10-CM | POA: Diagnosis present

## 2024-08-17 DIAGNOSIS — D573 Sickle-cell trait: Secondary | ICD-10-CM | POA: Diagnosis present

## 2024-08-17 DIAGNOSIS — Z7951 Long term (current) use of inhaled steroids: Secondary | ICD-10-CM

## 2024-08-17 DIAGNOSIS — I2510 Atherosclerotic heart disease of native coronary artery without angina pectoris: Secondary | ICD-10-CM | POA: Diagnosis present

## 2024-08-17 DIAGNOSIS — K8020 Calculus of gallbladder without cholecystitis without obstruction: Secondary | ICD-10-CM

## 2024-08-17 DIAGNOSIS — J4489 Other specified chronic obstructive pulmonary disease: Secondary | ICD-10-CM | POA: Diagnosis present

## 2024-08-17 DIAGNOSIS — Z79899 Other long term (current) drug therapy: Secondary | ICD-10-CM

## 2024-08-17 DIAGNOSIS — K8012 Calculus of gallbladder with acute and chronic cholecystitis without obstruction: Principal | ICD-10-CM | POA: Diagnosis present

## 2024-08-17 LAB — COMPREHENSIVE METABOLIC PANEL WITH GFR
ALT: 13 U/L (ref 0–44)
AST: 27 U/L (ref 15–41)
Albumin: 4.3 g/dL (ref 3.5–5.0)
Alkaline Phosphatase: 83 U/L (ref 38–126)
Anion gap: 13 (ref 5–15)
BUN: 11 mg/dL (ref 8–23)
CO2: 22 mmol/L (ref 22–32)
Calcium: 9.5 mg/dL (ref 8.9–10.3)
Chloride: 104 mmol/L (ref 98–111)
Creatinine, Ser: 0.87 mg/dL (ref 0.44–1.00)
GFR, Estimated: >60 mL/min (ref >60)
Glucose, Bld: 123 mg/dL — ABNORMAL HIGH (ref 70–99)
Potassium: 4.6 mmol/L (ref 3.5–5.1)
Sodium: 139 mmol/L (ref 135–145)
Total Bilirubin: 0.4 mg/dL (ref 0.0–1.2)
Total Protein: 7.4 g/dL (ref 6.5–8.1)

## 2024-08-17 LAB — GLUCOSE, CAPILLARY
Glucose-Capillary: 93 mg/dL (ref 70–99)
Glucose-Capillary: 79 mg/dL (ref 70–99)
Glucose-Capillary: 83 mg/dL (ref 70–99)

## 2024-08-17 LAB — TROPONIN T, HIGH SENSITIVITY
Troponin T High Sensitivity: <15 ng/L (ref 0–19)
Troponin T High Sensitivity: <15 ng/L (ref 0–19)

## 2024-08-17 LAB — CBC
HCT: 33.4 % — ABNORMAL LOW (ref 36.0–46.0)
Hemoglobin: 10.6 g/dL — ABNORMAL LOW (ref 12.0–15.0)
MCH: 28.1 pg (ref 26.0–34.0)
MCHC: 31.7 g/dL (ref 30.0–36.0)
MCV: 88.6 fL (ref 80.0–100.0)
Platelets: 276 K/uL (ref 150–400)
RBC: 3.77 MIL/uL — ABNORMAL LOW (ref 3.87–5.11)
RDW: 13.7 % (ref 11.5–15.5)
WBC: 10.1 K/uL (ref 4.0–10.5)
nRBC: 0 % (ref 0.0–0.2)

## 2024-08-17 LAB — HEMOGLOBIN A1C
Hgb A1c MFr Bld: 5 % (ref 4.8–5.6)
Mean Plasma Glucose: 96.8 mg/dL

## 2024-08-17 LAB — LIPASE, BLOOD: Lipase: 39 U/L (ref 11–51)

## 2024-08-17 MED ORDER — CEFAZOLIN SODIUM-DEXTROSE 2-4 GM/100ML-% IV SOLN
2.0000 g | INTRAVENOUS | Status: AC
  Administered 2024-08-18: 2 g via INTRAVENOUS

## 2024-08-17 MED ORDER — FLUTICASONE PROPIONATE 50 MCG/ACT NA SUSP: 2.0000 | Freq: Every day | NASAL | Status: DC | PRN

## 2024-08-17 MED ORDER — ACETAMINOPHEN 325 MG PO TABS
650.0000 mg | ORAL_TABLET | Freq: Four times a day (QID) | ORAL | Status: DC | PRN
Start: 2024-08-17 — End: 2024-08-17

## 2024-08-17 MED ORDER — EZETIMIBE 10 MG PO TABS
10.0000 mg | ORAL_TABLET | Freq: Every day | ORAL | Status: DC
  Administered 2024-08-17 – 2024-08-19 (×2): 10 mg via ORAL
  Filled 2024-08-17 (×2): qty 1

## 2024-08-17 MED ORDER — ENOXAPARIN SODIUM 60 MG/0.6ML IJ SOSY
50.0000 mg | PREFILLED_SYRINGE | INTRAMUSCULAR | Status: DC
  Administered 2024-08-17: 50 mg via SUBCUTANEOUS
  Filled 2024-08-17: qty 0.6

## 2024-08-17 MED ORDER — GADOBUTROL 1 MMOL/ML IV SOLN
10.0000 mL | Freq: Once | INTRAVENOUS | Status: AC | PRN
  Administered 2024-08-17: 10 mL via INTRAVENOUS

## 2024-08-17 MED ORDER — ALBUTEROL SULFATE (2.5 MG/3ML) 0.083% IN NEBU
5.0000 mg | INHALATION_SOLUTION | Freq: Once | RESPIRATORY_TRACT | Status: AC
  Administered 2024-08-17: 5 mg via RESPIRATORY_TRACT
  Filled 2024-08-17: qty 6

## 2024-08-17 MED ORDER — FAMOTIDINE 20 MG PO TABS
20.0000 mg | ORAL_TABLET | Freq: Once | ORAL | Status: AC
  Administered 2024-08-17: 20 mg via ORAL
  Filled 2024-08-17: qty 1

## 2024-08-17 MED ORDER — ATORVASTATIN CALCIUM 20 MG PO TABS
80.0000 mg | ORAL_TABLET | Freq: Every evening | ORAL | Status: DC
  Administered 2024-08-17 – 2024-08-18 (×2): 80 mg via ORAL
  Filled 2024-08-17 (×2): qty 4

## 2024-08-17 MED ORDER — IPRATROPIUM BROMIDE 0.02 % IN SOLN
0.5000 mg | Freq: Once | RESPIRATORY_TRACT | Status: AC
  Administered 2024-08-17: 0.5 mg via RESPIRATORY_TRACT
  Filled 2024-08-17: qty 2.5

## 2024-08-17 MED ORDER — ACETAMINOPHEN 500 MG PO TABS
1000.0000 mg | ORAL_TABLET | Freq: Three times a day (TID) | ORAL | Status: DC
  Administered 2024-08-17 – 2024-08-18 (×2): 1000 mg via ORAL
  Filled 2024-08-17 (×2): qty 2

## 2024-08-17 MED ORDER — ONDANSETRON HCL 4 MG/2ML IJ SOLN
4.0000 mg | Freq: Once | INTRAMUSCULAR | Status: AC
  Administered 2024-08-17: 4 mg via INTRAVENOUS
  Filled 2024-08-17: qty 2

## 2024-08-17 MED ORDER — MORPHINE SULFATE (PF) 4 MG/ML IV SOLN
4.0000 mg | Freq: Once | INTRAVENOUS | Status: AC
  Administered 2024-08-17: 4 mg via INTRAVENOUS
  Filled 2024-08-17: qty 1

## 2024-08-17 MED ORDER — INDOCYANINE GREEN 25 MG IV SOLR
1.2500 mg | INTRAVENOUS | Status: AC
  Administered 2024-08-18: 5 mg via INTRAVENOUS
  Filled 2024-08-17: qty 10

## 2024-08-17 MED ORDER — MONTELUKAST SODIUM 10 MG PO TABS
10.0000 mg | ORAL_TABLET | Freq: Every day | ORAL | Status: DC
  Administered 2024-08-17 – 2024-08-18 (×2): 10 mg via ORAL
  Filled 2024-08-17 (×2): qty 1

## 2024-08-17 MED ORDER — ACETAMINOPHEN 650 MG RE SUPP: 650.0000 mg | Freq: Four times a day (QID) | RECTAL | Status: DC | PRN

## 2024-08-17 MED ORDER — HYDROMORPHONE HCL 1 MG/ML IJ SOLN: 0.5000 mg | INTRAMUSCULAR | Status: DC | PRN

## 2024-08-17 MED ORDER — ASPIRIN 81 MG PO TBEC
81.0000 mg | DELAYED_RELEASE_TABLET | Freq: Every day | ORAL | Status: DC
  Administered 2024-08-17: 81 mg via ORAL
  Filled 2024-08-17: qty 1

## 2024-08-17 MED ORDER — HYDRALAZINE HCL 20 MG/ML IJ SOLN: 5.0000 mg | Freq: Four times a day (QID) | INTRAMUSCULAR | Status: DC | PRN

## 2024-08-17 MED ORDER — HYDROCHLOROTHIAZIDE 12.5 MG PO TABS: 12.5000 mg | ORAL_TABLET | Freq: Every day | ORAL | Status: DC

## 2024-08-17 MED ORDER — MORPHINE SULFATE (PF) 4 MG/ML IV SOLN
4.0000 mg | Freq: Once | INTRAVENOUS | Status: DC
  Filled 2024-08-17: qty 1

## 2024-08-17 MED ORDER — ACETAMINOPHEN 500 MG PO TABS
1000.0000 mg | ORAL_TABLET | Freq: Once | ORAL | Status: AC
  Administered 2024-08-17: 1000 mg via ORAL
  Filled 2024-08-17: qty 2

## 2024-08-17 MED ORDER — ALBUTEROL SULFATE (2.5 MG/3ML) 0.083% IN NEBU
2.5000 mg | INHALATION_SOLUTION | Freq: Every day | RESPIRATORY_TRACT | Status: DC | PRN
  Administered 2024-08-18: 2.5 mg via RESPIRATORY_TRACT
  Filled 2024-08-17: qty 3

## 2024-08-17 MED ORDER — METOPROLOL SUCCINATE ER 25 MG PO TB24
25.0000 mg | ORAL_TABLET | Freq: Every day | ORAL | Status: DC
  Administered 2024-08-17 – 2024-08-18 (×2): 25 mg via ORAL
  Filled 2024-08-17 (×2): qty 1

## 2024-08-17 MED ORDER — ONDANSETRON HCL 4 MG PO TABS
4.0000 mg | ORAL_TABLET | Freq: Four times a day (QID) | ORAL | Status: DC | PRN
  Administered 2024-08-18: 4 mg via ORAL
  Filled 2024-08-17: qty 1

## 2024-08-17 MED ORDER — LOSARTAN POTASSIUM 25 MG PO TABS: 25.0000 mg | ORAL_TABLET | Freq: Every day | ORAL | Status: DC

## 2024-08-17 MED ORDER — ALUM & MAG HYDROXIDE-SIMETH 200-200-20 MG/5ML PO SUSP
30.0000 mL | Freq: Once | ORAL | Status: AC
  Administered 2024-08-17: 30 mL via ORAL
  Filled 2024-08-17: qty 30

## 2024-08-17 MED ORDER — BUDESON-GLYCOPYRROL-FORMOTEROL 160-9-4.8 MCG/ACT IN AERO
2.0000 | INHALATION_SPRAY | Freq: Two times a day (BID) | RESPIRATORY_TRACT | Status: DC
  Administered 2024-08-17 – 2024-08-19 (×5): 2 via RESPIRATORY_TRACT
  Filled 2024-08-17: qty 5.9

## 2024-08-17 MED ORDER — INSULIN ASPART 100 UNIT/ML IJ SOLN: 0.0000 [IU] | Freq: Three times a day (TID) | INTRAMUSCULAR | Status: DC

## 2024-08-17 MED ORDER — ONDANSETRON HCL 4 MG/2ML IJ SOLN: 4.0000 mg | Freq: Four times a day (QID) | INTRAMUSCULAR | Status: DC | PRN

## 2024-08-17 NOTE — ED Triage Notes (Signed)
 Patient is complaining of chest pain. States pain is a dull ache, started about 2am. Denies N/V at triage. States she did drink some vinegar thinking it was heartburn and she vomited. Pain is constant.

## 2024-08-17 NOTE — H&P (View-Only) (Signed)
 CC: stomach pain  Requesting provider: Dr Bernard  HPI: Diane Shaw is an 73 y.o. female who is here for evaluation for abdominal pain.  Patient has a past medical history significant for  non-obstructive CAD, HTN, IIDM, HLD, class I obesity, mild intermittent asthma who came to the emergency room because of acute onset of upper abdominal pain mainly in her right upper abdomen.  It woke her up this morning it was associated with nausea but later vomiting after trying to treat her pain with apple cider vinegar.  It was initially intermittent discomfort but was constant so she ended up coming to the emergency room.  Denies any fever chills or diarrhea.  In the emergency room she was found to have numerous gallstones on ultrasound as well as a dilated common bile duct of around 9 mm.  Denies prior symptoms.  No melena hematochezia.  Denies chest pain, chest pressure, shortness of breath, does get some shortness of breath with activity due to her asthma  No TIAs or amaurosis fugax  Past Medical History:  Diagnosis Date   Adrenal nodule    Alcohol use    Allergic rhinitis    Aortic atherosclerosis    Arthritis    RIGHT shoulder   Asthma    uses inhaler and nebulizer PRN   BMI 37.0-37.9, adult    CAD (coronary artery disease)    CHF (congestive heart failure) (HCC)    COPD (chronic obstructive pulmonary disease) (HCC)    GERD (gastroesophageal reflux disease)    diet controlled-OTC PRN   HTN (hypertension)    on meds   Hyperlipidemia    on meds   Hypertensive heart disease with CHF (congestive heart failure) (HCC)    Left ventricular hypertrophy    Obese    Peripheral vascular disease    Sickle cell trait    SVT (supraventricular tachycardia)    Trapezius muscle strain    Urinary frequency    Vitamin D deficiency     Past Surgical History:  Procedure Laterality Date   RIGHT/LEFT HEART CATH AND CORONARY ANGIOGRAPHY N/A 02/27/2024   Procedure: RIGHT/LEFT HEART CATH AND  CORONARY ANGIOGRAPHY;  Surgeon: Wendel Lurena POUR, MD;  Location: [facility];  Service: Cardiovascular;  Laterality: N/A;   TUBAL LIGATION      Family History  Problem Relation Age of Onset   Heart failure Sister    Diabetes Sister    Heart disease Sister        chf   Diabetes Mother    Hypertension Mother    Heart disease Mother        chf   Heart disease Father 51       MI   Hypertension Father    Heart disease Brother    Arthritis Sister    Heart disease Brother        Cabg   Colon polyps Brother 78   Stomach cancer Son 4   Colon cancer Neg Hx    Esophageal cancer Neg Hx    Rectal cancer Neg Hx     Social:  reports that she has never smoked. She has never used smokeless tobacco. She reports current alcohol use of about 3.0 standard drinks of alcohol per week. She reports that she does not use drugs.  Allergies: No Known Allergies  Medications: I have reviewed the patient's current medications.   ROS - all of the below systems have been reviewed with the patient and positives are indicated with  bold text General: chills, fever or night sweats Eyes: blurry vision or double vision ENT: epistaxis or sore throat Allergy/Immunology: itchy/watery eyes or nasal congestion Hematologic/Lymphatic: bleeding problems, blood clots or swollen lymph nodes Endocrine: temperature intolerance or unexpected weight changes Breast: new or changing breast lumps or nipple discharge Resp: cough, shortness of breath, or wheezing CV: chest pain or dyspnea on exertion GI: as per HPI GU: dysuria, trouble voiding, or hematuria MSK: joint pain or joint stiffness Neuro: TIA or stroke symptoms Derm: pruritus and skin lesion changes Psych: anxiety and depression  PE Blood pressure 124/66, pulse 73, temperature 97.7 F (36.5 C), temperature source Oral, resp. rate 14, height 5' 5 (1.651 m), weight 92.5 kg, SpO2 100%. Constitutional: NAD; conversant; no deformities Eyes: Moist  conjunctiva; no lid lag; anicteric; PERRL Neck: Trachea midline; no thyromegaly Lungs: Normal respiratory effort; no tactile fremitus CV: RRR; no palpable thrills; no pitting edema GI: Abd soft, nt, nd; no palpable hepatosplenomegaly MSK:  no clubbing/cyanosis Psychiatric: Appropriate affect; alert and oriented x3 Lymphatic: No palpable cervical or axillary lymphadenopathy Skin:no rash  Results for orders placed or performed during the hospital encounter of 08/17/24 (from the past 48 hours)  CBC     Status: Abnormal   Collection Time: 08/17/24  8:30 AM  Result Value Ref Range   WBC 10.1 4.0 - 10.5 K/uL   RBC 3.77 (L) 3.87 - 5.11 MIL/uL   Hemoglobin 10.6 (L) 12.0 - 15.0 g/dL   HCT 66.5 (L) 63.9 - 53.9 %   MCV 88.6 80.0 - 100.0 fL   MCH 28.1 26.0 - 34.0 pg   MCHC 31.7 30.0 - 36.0 g/dL   RDW 86.2 88.4 - 84.4 %   Platelets 276 150 - 400 K/uL   nRBC 0.0 0.0 - 0.2 %    Comment: Performed at [facility], [address], [place], [place] [address]  Troponin T, High Sensitivity     Status: None   Collection Time: 08/17/24  8:30 AM  Result Value Ref Range   Troponin T High Sensitivity <15 0 - 19 ng/L    Comment: (NOTE) Biotin concentrations > 1000 ng/mL falsely decrease TnT results.  Serial cardiac troponin measurements are suggested.  Refer to the Links section for chest pain algorithms and additional  guidance. Performed at [facility], [address] [place], [place] [address]   Comprehensive metabolic panel with GFR     Status: Abnormal   Collection Time: 08/17/24  8:30 AM  Result Value Ref Range   Sodium 139 135 - 145 mmol/L   Potassium 4.6 3.5 - 5.1 mmol/L    Comment: HEMOLYSIS AT THIS LEVEL MAY AFFECT RESULT   Chloride 104 98 - 111 mmol/L   CO2 22 22 - 32 mmol/L   Glucose, Bld 123 (H) 70 - 99 mg/dL    Comment: Glucose reference range applies only to samples taken after fasting for at least 8 hours.   BUN 11 8 - 23 mg/dL    Creatinine, Ser 9.12 0.44 - 1.00 mg/dL   Calcium  9.5 8.9 - 10.3 mg/dL   Total Protein 7.4 6.5 - 8.1 g/dL   Albumin 4.3 3.5 - 5.0 g/dL   AST 27 15 - 41 U/L    Comment: HEMOLYSIS AT THIS LEVEL MAY AFFECT RESULT   ALT 13 0 - 44 U/L   Alkaline Phosphatase 83 38 - 126 U/L   Total Bilirubin 0.4 0.0 - 1.2 mg/dL   GFR, Estimated >39 >39 mL/min  Comment: (NOTE) Calculated using the CKD-EPI Creatinine Equation (2021)    Anion gap 13 5 - 15    Comment: Performed at [facility], [address] [place], [place] [address]  Lipase, blood     Status: None   Collection Time: 08/17/24  8:30 AM  Result Value Ref Range   Lipase 39 11 - 51 U/L    Comment: Performed at [facility], [address] [place], [place] [address]  Troponin T, High Sensitivity     Status: None   Collection Time: 08/17/24 10:47 AM  Result Value Ref Range   Troponin T High Sensitivity <15 0 - 19 ng/L    Comment: (NOTE) Biotin concentrations > 1000 ng/mL falsely decrease TnT results.  Serial cardiac troponin measurements are suggested.  Refer to the Links section for chest pain algorithms and additional  guidance. Performed at [facility], [address] [place], [place] [address]   Glucose, capillary     Status: None   Collection Time: 08/17/24  1:00 PM  Result Value Ref Range   Glucose-Capillary 93 70 - 99 mg/dL    Comment: Glucose reference range applies only to samples taken after fasting for at least 8 hours.  Hemoglobin A1c     Status: None   Collection Time: 08/17/24  1:15 PM  Result Value Ref Range   Hgb A1c MFr Bld 5.0 4.8 - 5.6 %    Comment: (NOTE) Diagnosis of Diabetes The following HbA1c ranges recommended by the American Diabetes Association (ADA) may be used as an aid in the diagnosis of diabetes mellitus.  Hemoglobin             Suggested A1C NGSP%              Diagnosis  <5.7                   Non Diabetic  5.7-6.4                 Pre-Diabetic  >6.4                   Diabetic  <7.0                   Glycemic control for                       adults with diabetes.     Mean Plasma Glucose 96.8 mg/dL    Comment: Performed at [facility], [address] [place], [place] [address]  Glucose, capillary     Status: None   Collection Time: 08/17/24  4:31 PM  Result Value Ref Range   Glucose-Capillary 79 70 - 99 mg/dL    Comment: Glucose reference range applies only to samples taken after fasting for at least 8 hours.    MR 3D Recon At Scanner Result Date: 08/17/2024 XAM: MRCP WITH AND WITHOUT IV CONTRAST 08/17/2024 02: 49:11 PM TECHNIQUE: Multisequence, multiplanar magnetic resonance images of the abdomen with and without intravenous contrast. MRCP sequences were performed. 10 mL gadobutrol  (GADAVIST ) 1 MMOL/ML injection was administered. COMPARISON: Ultrasound 08/17/2024. CLINICAL HISTORY: Cholelithiasis; r/o CBD stone. FINDINGS: LIVER: Unremarkable. GALLBLADDER AND BILIARY SYSTEM: There are multiple small 1 mm stones within the fundus of the gallbladder which are change numerous to count There is a larger 18 mm faceted stone towards the neck of the gallbladder. The gallbladder is nondistended measuring 3 cm in  diameter. No gallbladder wall thickening and pericholecystic fluid. The common bile duct is mildly dilated at 5 to 10 mm. The common bile duct tapers to the ampulla without obstructing calculus. No intrahepatic ductal dilatation. SPLEEN: Unremarkable. PANCREAS/PANCREATIC DUCT: There is mild pancreatic ductal dilatation throughout the head, body, and tail of the pancreas. The pancreatic duct measures 5 mm on image 15/3. No acute pancreatic inflammation. ADRENAL GLANDS: Unremarkable. KIDNEYS: There are bilateral fluid intensity cysts within the left and right kidneys. These show no postcontrast enhancement, consistent with benign renal cysts. No hydronephrosis. LYMPH NODES: No enlarged abdominal lymph nodes.  VASCULATURE: Unremarkable. PERITONEUM: No ascites. ABDOMINAL WALL: No hernia. No mass. BOWEL: Grossly unremarkable. No bowel obstruction. BONES: No acute abnormality or worrisome osseous lesion. SOFT TISSUES: Unremarkable. MISCELLANEOUS: Unremarkable. IMPRESSION: 1. Cholelithiasis with multiple small gallstones and an 18 mm gallstone near the gallbladder neck, without imaging signs of acute cholecystitis. 2. Mild dilation of the common bile duct without an obstructing calculus identified. 3. Mild diffuse pancreatic ductal dilatation without acute pancreatitis. . Electronically signed by: Norleen Boxer MD 08/17/2024 03:06 PM EST RP Workstation: [id number]   MR ABDOMEN MRCP W WO CONTAST Result Date: 08/17/2024 EXAM: MRCP WITH AND WITHOUT IV CONTRAST 08/17/2024 02:49:11 PM TECHNIQUE: Multisequence, multiplanar magnetic resonance images of the abdomen with and without intravenous contrast. MRCP sequences were performed. 10 mL gadobutrol  (GADAVIST ) 1 MMOL/ML injection was administered. COMPARISON: Ultrasound 08/17/2024. CLINICAL HISTORY: Cholelithiasis; r/o CBD stone. FINDINGS: LIVER: Unremarkable. GALLBLADDER AND BILIARY SYSTEM: There are multiple small 1 mm stones within the fundus of the gallbladder which are change numerous to count There is a larger 18 mm faceted stone towards the neck of the gallbladder. The gallbladder is nondistended measuring 3 cm in diameter. No gallbladder wall thickening and pericholecystic fluid. The common bile duct is mildly dilated at 5 to 10 mm. The common bile duct tapers to the ampulla without obstructing calculus. No intrahepatic ductal dilatation. SPLEEN: Unremarkable. PANCREAS/PANCREATIC DUCT: There is mild pancreatic ductal dilatation throughout the head, body, and tail of the pancreas. The pancreatic duct measures 5 mm on image 15/3. No acute pancreatic inflammation. ADRENAL GLANDS: Unremarkable. KIDNEYS: There are bilateral fluid intensity cysts within the left and right  kidneys. These show no postcontrast enhancement, consistent with benign renal cysts. No hydronephrosis. LYMPH NODES: No enlarged abdominal lymph nodes. VASCULATURE: Unremarkable. PERITONEUM: No ascites. ABDOMINAL WALL: No hernia. No mass. BOWEL: Grossly unremarkable. No bowel obstruction. BONES: No acute abnormality or worrisome osseous lesion. SOFT TISSUES: Unremarkable. MISCELLANEOUS: Unremarkable. IMPRESSION: 1. Cholelithiasis with multiple small gallstones and an 18 mm gallstone near the gallbladder neck, without imaging signs of acute cholecystitis. 2. Mild dilation of the common bile duct without an obstructing calculus identified. 3. Mild diffuse pancreatic ductal dilatation without acute pancreatitis. Electronically signed by: Norleen Boxer MD 08/17/2024 03:01 PM EST RP Workstation: [id number]   DG Chest 2 View Result Date: 08/17/2024 CLINICAL DATA:  chest pain EXAM: DG CHEST 2V COMPARISON:  August 17, 2018 FINDINGS: The cardiomediastinal silhouette is mildly enlarged in contour.Atherosclerotic calcifications. No pleural effusion. No pneumothorax. No acute pleuroparenchymal abnormality. Visualized abdomen is unremarkable. Multilevel degenerative changes of the thoracic spine. Mild compression fracture deformities at the thoracolumbar junction, favored slightly increased since prior. IMPRESSION: 1. No acute cardiopulmonary abnormality. 2. Mild compression fracture deformities at the thoracolumbar junction, favored slightly increased since prior. Recommend correlation with point tenderness. Electronically Signed   By: Corean Salter M.D.   On: 08/17/2024 09:16   US  Abdomen Limited RUQ (LIVER/GB) Result Date:  08/17/2024 EXAM: Right Upper Quadrant Abdominal Ultrasound 08/17/2024 08:51:53 AM TECHNIQUE: Real-time ultrasonography of the right upper quadrant of the abdomen was performed. COMPARISON: Chest CT 11/10/2022. CLINICAL HISTORY: 73 year old female with abdominal pain. FINDINGS: LIVER: The liver  demonstrates normal echogenicity. Mild intrahepatic biliary ductal dilatation suspected. No evidence of mass. BILIARY SYSTEM: Confluent and shadowing gallstones within the gallbladder (series 1 image 4). Gallbladder wall thickness measures 0.2 cm, normal. No sonographic Murphy sign. No pericholecystic fluid. Abnormally dilated common bile duct, 8 to 9 mm. No visible filling defect within the bile duct (image 21 of series 1). OTHER: No right upper quadrant ascites. IMPRESSION: 1. Extensive Cholelithiasis, with Dilated common bile duct (9 mm) suspicious for choledocholithiasis. 2. No sonographic signs of acute cholecystitis. Electronically signed by: Helayne Hurst MD 08/17/2024 09:08 AM EST RP Workstation: [id number]    Imaging: Personally reviewed  A/P: Orlean HERO Mcginness is an 73 y.o. female with  Symptomatic cholelithiasis Dilated common bile duct Chronic anemia of unclear etiology  non-obstructive CAD,  HTN,  IIDM,  HLD,  Class 1 obesity  mild intermittent asthma   Symptomatic cholelithiasis MRI negative for choledocholithiasis Planned laparoscopic cholecystectomy in a.m. Full liquids tonight, n.p.o. except meds after midnight IV antibiotic on-call to the OR Will give preoperative ICG dye  I believe the patient's symptoms are consistent with gallbladder disease.  We discussed gallbladder disease.   I discussed laparoscopic cholecystectomy in detail.  The patient was shown diagrams detailing the procedure.  We discussed the risks and benefits of a laparoscopic cholecystectomy including, but not limited to bleeding, infection, injury to surrounding structures such as the intestine or liver, bile leak, retained gallstones, need to convert to an open procedure, prolonged diarrhea, blood clots such as  DVT, common bile duct injury, anesthesia risks, and possible need for additional procedures.  We discussed the typical post-operative recovery course. I explained that the likelihood of  improvement of their symptoms is good.  High MDM  Data reviewed: Reviewed ED notes, MRI/MRCP labs from this morning as well as trended her CBCs for the past 6 months, hospitalist H&P, pulmonary office note from September 5, cardiology office note from September night  Camellia HERO. Tanda, MD, FACS General, Bariatric, & Minimally Invasive Surgery [facility]

## 2024-08-17 NOTE — H&P (Signed)
 History and Physical    [name] FMW:[id number] DOB: December 19, 1950 DOA: 08/17/2024  PCP: Campbell Reynolds, NP (Confirm with patient/family/NH records and if not entered, this has to be entered at [facility] point of entry) Patient coming from: Home  I have personally briefly reviewed patient's old medical records in [facility]  Chief Complaint: Belly hurts  HPI: Diane Shaw is a 73 y.o. female with medical history significant of non-obstructive CAD, HTN, IIDM, HLD, morbid obesity, mild intermittent asthma, presented with new onset of abdominal pain.  Patient woke up this morning around 2 AM with severe cramping-like right upper quadrant to epigastric abdominal pain, associated with nausea but no vomiting.  The pain was intermittent eased by itself however in the morning, the pain became constant.  No significant exacerbation or relieving factors.  Denied any fever or chills no diarrhea.  ED Course: Afebrile, blood pressure borderline low, 116/50 O2 saturation 100% on room air.  RUQ ultrasound showed gallstones, with borderline dilation of CBD.  Blood work showed AST 27 ALT 11, bilirubin 0.4, BUN 11 creatinine 0.8, glucose 125.  Lipase normal.  Patient was given IV pain medications and RUQ abdominal pain improved.  General surgeon consulted who ordered MRCP.  Review of Systems: As per HPI otherwise 14 point review of systems negative.    Past Medical History:  Diagnosis Date   Adrenal nodule    Alcohol use    Allergic rhinitis    Aortic atherosclerosis    Arthritis    RIGHT shoulder   Asthma    uses inhaler and nebulizer PRN   BMI 37.0-37.9, adult    CAD (coronary artery disease)    CHF (congestive heart failure) (HCC)    COPD (chronic obstructive pulmonary disease) (HCC)    GERD (gastroesophageal reflux disease)    diet controlled-OTC PRN   HTN (hypertension)    on meds   Hyperlipidemia    on meds   Hypertensive heart disease with CHF (congestive heart failure) (HCC)     Left ventricular hypertrophy    Obese    Peripheral vascular disease    Sickle cell trait    SVT (supraventricular tachycardia)    Trapezius muscle strain    Urinary frequency    Vitamin D deficiency     Past Surgical History:  Procedure Laterality Date   RIGHT/LEFT HEART CATH AND CORONARY ANGIOGRAPHY N/A 02/27/2024   Procedure: RIGHT/LEFT HEART CATH AND CORONARY ANGIOGRAPHY;  Surgeon: Wendel Lurena POUR, MD;  Location: [facility] INVASIVE CV LAB;  Service: Cardiovascular;  Laterality: N/A;   TUBAL LIGATION       reports that she has never smoked. She has never used smokeless tobacco. She reports current alcohol use of about 3.0 standard drinks of alcohol per week. She reports that she does not use drugs.  No Known Allergies  Family History  Problem Relation Age of Onset   Heart failure Sister    Diabetes Sister    Heart disease Sister        chf   Diabetes Mother    Hypertension Mother    Heart disease Mother        chf   Heart disease Father 67       MI   Hypertension Father    Heart disease Brother    Arthritis Sister    Heart disease Brother        Cabg   Colon polyps Brother 10   Stomach cancer Son 10   Colon cancer Neg  Hx    Esophageal cancer Neg Hx    Rectal cancer Neg Hx      Prior to Admission medications   Medication Sig Start Date End Date Taking? Authorizing Provider  albuterol  (ACCUNEB ) 0.63 MG/3ML nebulizer solution Take 1 ampule by nebulization daily as needed (asthma). 02/16/24   [provider]  albuterol  (VENTOLIN  HFA) 108 (90 Base) MCG/ACT inhaler Inhale 2 puffs into the lungs every 4 (four) hours as needed for wheezing or shortness of breath. 10/30/20   Kara Dorn NOVAK, MD  aspirin  EC 81 MG tablet Take 81 mg by mouth daily. Swallow whole.    [provider]  atorvastatin  (LIPITOR) 80 MG tablet TAKE 1 TABLET(80 MG) BY MOUTH DAILY 07/08/24   Adams, Zane, PA-C  empagliflozin  (JARDIANCE ) 10 MG TABS tablet Take 1 tablet (10 mg total) by  mouth daily before breakfast. 06/04/24   Thukkani, Arun K, MD  empagliflozin  (JARDIANCE ) 10 MG TABS tablet Take 1 tablet (10 mg total) by mouth daily before breakfast. 06/04/24   Thukkani, Arun K, MD  ezetimibe  (ZETIA ) 10 MG tablet Take 1 tablet (10 mg total) by mouth daily. 06/21/24   Thukkani, Arun K, MD  fluticasone  (FLONASE ) 50 MCG/ACT nasal spray Place 2 sprays into both nostrils daily as needed for allergies. 02/09/24   [provider]  Fluticasone -Umeclidin-Vilant (TRELEGY ELLIPTA ) 100-62.5-25 MCG/INH AEPB Inhale 1 puff into the lungs daily. 10/30/20   Kara Dorn NOVAK, MD  Fluticasone -Umeclidin-Vilant (TRELEGY ELLIPTA ) 200-62.5-25 MCG/ACT AEPB Inhale 1 puff into the lungs daily. 03/06/24   Olalere, Jennet LABOR, MD  hydrochlorothiazide  (HYDRODIURIL ) 12.5 MG tablet Take 12.5 mg by mouth daily. 01/09/24   [provider]  losartan  (COZAAR ) 25 MG tablet Take 1 tablet (25 mg total) by mouth daily. 06/04/24   Thukkani, Arun K, MD  metFORMIN (GLUCOPHAGE-XR) 500 MG 24 hr tablet Take 500 mg by mouth daily. 04/06/24   [provider]  metoprolol  succinate (TOPROL -XL) 25 MG 24 hr tablet Take 1 tablet (25 mg total) by mouth at bedtime. 06/28/24   Thukkani, Arun K, MD  montelukast  (SINGULAIR ) 10 MG tablet Take 1 tablet (10 mg total) by mouth at bedtime. 10/30/20   Kara Dorn NOVAK, MD  Multiple Vitamins-Minerals (MULTIVITAMINS THER. W/MINERALS) TABS Take 1 tablet by mouth daily.    [provider]  OZEMPIC, 2 MG/DOSE, 8 MG/3ML SOPN Inject 2 mg into the skin once a week. 01/01/24   [provider]  vitamin B-12 (CYANOCOBALAMIN ) 500 MCG tablet Take 500 mcg by mouth daily.    [provider]    Physical Exam: Vitals:   08/17/24 1015 08/17/24 1030 08/17/24 1045 08/17/24 1125  BP: (!) 117/55 127/72 130/63   Pulse: 64 73 70   Resp: 14 17 (!) 22   Temp:    97.6 F (36.4 C)  TempSrc:    Oral  SpO2: (!) 88% 98% 95%   Weight:      Height:        Constitutional:  NAD, calm, comfortable Vitals:   08/17/24 1015 08/17/24 1030 08/17/24 1045 08/17/24 1125  BP: (!) 117/55 127/72 130/63   Pulse: 64 73 70   Resp: 14 17 (!) 22   Temp:    97.6 F (36.4 C)  TempSrc:    Oral  SpO2: (!) 88% 98% 95%   Weight:      Height:       Eyes: PERRL, lids and conjunctivae normal ENMT: Mucous membranes are moist. Posterior pharynx clear of any exudate  or lesions.Normal dentition.  Neck: normal, supple, no masses, no thyromegaly Respiratory: clear to auscultation bilaterally, no wheezing, no crackles. Normal respiratory effort. No accessory muscle use.  Cardiovascular: Regular rate and rhythm, no murmurs / rubs / gallops. No extremity edema. 2+ pedal pulses. No carotid bruits.  Abdomen: Mild tenderness on RUQ, no rebound or guarding., no masses palpated. No hepatosplenomegaly. Bowel sounds positive.  Musculoskeletal: no clubbing / cyanosis. No joint deformity upper and lower extremities. Good ROM, no contractures. Normal muscle tone.  Skin: no rashes, lesions, ulcers. No induration Neurologic: CN 2-12 grossly intact. Sensation intact, DTR normal. Strength 5/5 in all 4.  Psychiatric: Normal judgment and insight. Alert and oriented x 3. Normal mood.     Labs on Admission: I have personally reviewed following labs and imaging studies  CBC: Recent Labs  Lab 08/17/24 0830  WBC 10.1  HGB 10.6*  HCT 33.4*  MCV 88.6  PLT 276   Basic Metabolic Panel: Recent Labs  Lab 08/17/24 0830  NA 139  K 4.6  CL 104  CO2 22  GLUCOSE 123*  BUN 11  CREATININE 0.87  CALCIUM  9.5   GFR: Estimated Creatinine Clearance: 63.8 mL/min (by C-G formula based on SCr of 0.87 mg/dL). Liver Function Tests: Recent Labs  Lab 08/17/24 0830  AST 27  ALT 13  ALKPHOS 83  BILITOT 0.4  PROT 7.4  ALBUMIN 4.3   Recent Labs  Lab 08/17/24 0830  LIPASE 39   No results for input(s): AMMONIA in the last 168 hours. Coagulation Profile: No results for input(s): INR, PROTIME in  the last 168 hours. Cardiac Enzymes: No results for input(s): CKTOTAL, CKMB, CKMBINDEX, TROPONINI in the last 168 hours. BNP (last 3 results) No results for input(s): PROBNP in the last 8760 hours. HbA1C: No results for input(s): HGBA1C in the last 72 hours. CBG: No results for input(s): GLUCAP in the last 168 hours. Lipid Profile: No results for input(s): CHOL, HDL, LDLCALC, TRIG, CHOLHDL, LDLDIRECT in the last 72 hours. Thyroid Function Tests: No results for input(s): TSH, T4TOTAL, FREET4, T3FREE, THYROIDAB in the last 72 hours. Anemia Panel: No results for input(s): VITAMINB12, FOLATE, FERRITIN, TIBC, IRON, RETICCTPCT in the last 72 hours. Urine analysis:    Component Value Date/Time   BILIRUBINUR neg 05/26/2015 1530   PROTEINUR neg 05/26/2015 1530   UROBILINOGEN 0.2 05/26/2015 1530   NITRITE neg 05/26/2015 1530   LEUKOCYTESUR large (3+) (A) 05/26/2015 1530    Radiological Exams on Admission: DG Chest 2 View Result Date: 08/17/2024 CLINICAL DATA:  chest pain EXAM: DG CHEST 2V COMPARISON:  August 17, 2018 FINDINGS: The cardiomediastinal silhouette is mildly enlarged in contour.Atherosclerotic calcifications. No pleural effusion. No pneumothorax. No acute pleuroparenchymal abnormality. Visualized abdomen is unremarkable. Multilevel degenerative changes of the thoracic spine. Mild compression fracture deformities at the thoracolumbar junction, favored slightly increased since prior. IMPRESSION: 1. No acute cardiopulmonary abnormality. 2. Mild compression fracture deformities at the thoracolumbar junction, favored slightly increased since prior. Recommend correlation with point tenderness. Electronically Signed   By: Corean Salter M.D.   On: 08/17/2024 09:16   US  Abdomen Limited RUQ (LIVER/GB) Result Date: 08/17/2024 EXAM: Right Upper Quadrant Abdominal Ultrasound 08/17/2024 08:51:53 AM TECHNIQUE: Real-time ultrasonography of the  right upper quadrant of the abdomen was performed. COMPARISON: Chest CT 11/10/2022. CLINICAL HISTORY: 73 year old female with abdominal pain. FINDINGS: LIVER: The liver demonstrates normal echogenicity. Mild intrahepatic biliary ductal dilatation suspected. No evidence of mass. BILIARY SYSTEM: Confluent and shadowing gallstones within the gallbladder (series 1  image 4). Gallbladder wall thickness measures 0.2 cm, normal. No sonographic Murphy sign. No pericholecystic fluid. Abnormally dilated common bile duct, 8 to 9 mm. No visible filling defect within the bile duct (image 21 of series 1). OTHER: No right upper quadrant ascites. IMPRESSION: 1. Extensive Cholelithiasis, with Dilated common bile duct (9 mm) suspicious for choledocholithiasis. 2. No sonographic signs of acute cholecystitis. Electronically signed by: Helayne Hurst MD 08/17/2024 09:08 AM EST RP Workstation: [id number]    EKG: Independently reviewed.  Sinus rhythm, nonspecific ST changes.  Assessment/Plan Principal Problem:   Cholelithiasis  (please populate well all problems here in Problem List. (For example, if patient is on BP meds at home and you resume or decide to hold them, it is a problem that needs to be her. Same for CAD, COPD, HLD and so on)  Cholelithiasis, symptomatic - Symptomatic management - Currently there is a low suspicion for acute cholecystitis or ascending colon tenderness, we will hold off antibiotics. - General Surgeon consulted - MRCP ordered - Other DDx, patient has a history of nonobstructive CAD, hide recent LHC 6 months ago showed nonobstructive CAD.  Troponin negative EKG negative for acute ST changes, ACS ruled out.  HTN - Blood pressure borderline low, hold off HCTZ and ARB - Continue metoprolol  low-dose - As needed hydralazine   IIDM -Patient is NPO, will hold off p.o. diabetic medications - SSI  Obesity - BMI= 33 - Patient already on Ozempic  Mild intermittent asthma - No breathing  symptoms - Continue Breztri  and as needed albuterol   Nonobstructive CAD - ACS ruled out - Continue aspirin  and statin   DVT prophylaxis: Lovenox  Code Status: Full code Family Communication: None at bedside Disposition Plan: Patient sick with symptomatic gallstone, requiring inpatient general surgery consultation and likely cholecystectomy, expect more than 2 midnight hospital stay. Consults called: General Surgeon Admission status: MedSurg admission   Cort ONEIDA Mana MD [facility] Pager [phone]  08/17/2024, 11:32 AM

## 2024-08-17 NOTE — Plan of Care (Signed)
  Problem: Fluid Volume: Goal: Ability to maintain a balanced intake and output will improve Outcome: Progressing   Problem: Metabolic: Goal: Ability to maintain appropriate glucose levels will improve Outcome: Progressing   Problem: Education: Goal: Knowledge of General Education information will improve Description: Including pain rating scale, medication(s)/side effects and non-pharmacologic comfort measures Outcome: Progressing   Problem: Activity: Goal: Risk for activity intolerance will decrease Outcome: Progressing   Problem: Nutrition: Goal: Adequate nutrition will be maintained Outcome: Progressing   Problem: Elimination: Goal: Will not experience complications related to bowel motility Outcome: Progressing Goal: Will not experience complications related to urinary retention Outcome: Progressing   Problem: Clinical Measurements: Goal: Ability to maintain clinical measurements within normal limits will improve Outcome: Progressing   Problem: Pain Managment: Goal: General experience of comfort will improve and/or be controlled Outcome: Progressing

## 2024-08-17 NOTE — ED Provider Notes (Signed)
 [place] EMERGENCY DEPARTMENT AT [facility] Provider Note   CSN: [id number] Arrival date & time: 08/17/24  9290     Patient presents with: No chief complaint on file.   Diane Shaw is a 73 y.o. female.   Pt c/o pain to 'chest' - points to subxiphoid and right lower chest/upper abd area. Symptoms occurred at rest around 2 am, constant, dull, non radiating, no hx same pain. No new or worsening sob, nv or diaphoresis. No recent exertional chest pain. No pleuritic chest pain. No new or worsening cough or uri symptoms. No fever or chills. Denies hx gallstones or pancreatitis or pud.  No leg pain or swelling.   The history is provided by the patient and medical records.       Prior to Admission medications   Medication Sig Start Date End Date Taking? Authorizing Provider  albuterol  (ACCUNEB ) 0.63 MG/3ML nebulizer solution Take 1 ampule by nebulization daily as needed (asthma). 02/16/24   [provider]  albuterol  (VENTOLIN  HFA) 108 (90 Base) MCG/ACT inhaler Inhale 2 puffs into the lungs every 4 (four) hours as needed for wheezing or shortness of breath. 10/30/20   Kara Dorn NOVAK, MD  aspirin  EC 81 MG tablet Take 81 mg by mouth daily. Swallow whole.    [provider]  atorvastatin  (LIPITOR) 80 MG tablet TAKE 1 TABLET(80 MG) BY MOUTH DAILY 07/08/24   Adams, Zane, PA-C  empagliflozin  (JARDIANCE ) 10 MG TABS tablet Take 1 tablet (10 mg total) by mouth daily before breakfast. 06/04/24   Thukkani, Arun K, MD  empagliflozin  (JARDIANCE ) 10 MG TABS tablet Take 1 tablet (10 mg total) by mouth daily before breakfast. 06/04/24   Thukkani, Arun K, MD  ezetimibe  (ZETIA ) 10 MG tablet Take 1 tablet (10 mg total) by mouth daily. 06/21/24   Thukkani, Arun K, MD  fluticasone  (FLONASE ) 50 MCG/ACT nasal spray Place 2 sprays into both nostrils daily as needed for allergies. 02/09/24   [provider]  Fluticasone -Umeclidin-Vilant (TRELEGY ELLIPTA ) 100-62.5-25 MCG/INH  AEPB Inhale 1 puff into the lungs daily. 10/30/20   Kara Dorn NOVAK, MD  Fluticasone -Umeclidin-Vilant (TRELEGY ELLIPTA ) 200-62.5-25 MCG/ACT AEPB Inhale 1 puff into the lungs daily. 03/06/24   Olalere, Jennet LABOR, MD  hydrochlorothiazide  (HYDRODIURIL ) 12.5 MG tablet Take 12.5 mg by mouth daily. 01/09/24   [provider]  losartan  (COZAAR ) 25 MG tablet Take 1 tablet (25 mg total) by mouth daily. 06/04/24   Thukkani, Arun K, MD  metFORMIN (GLUCOPHAGE-XR) 500 MG 24 hr tablet Take 500 mg by mouth daily. 04/06/24   [provider]  metoprolol  succinate (TOPROL -XL) 25 MG 24 hr tablet Take 1 tablet (25 mg total) by mouth at bedtime. 06/28/24   Thukkani, Arun K, MD  montelukast  (SINGULAIR ) 10 MG tablet Take 1 tablet (10 mg total) by mouth at bedtime. 10/30/20   Kara Dorn NOVAK, MD  Multiple Vitamins-Minerals (MULTIVITAMINS THER. W/MINERALS) TABS Take 1 tablet by mouth daily.    [provider]  OZEMPIC, 2 MG/DOSE, 8 MG/3ML SOPN Inject 2 mg into the skin once a week. 01/01/24   [provider]  vitamin B-12 (CYANOCOBALAMIN ) 500 MCG tablet Take 500 mcg by mouth daily.    [provider]    Allergies: Patient has no known allergies.    Review of Systems  Constitutional:  Negative for chills and fever.  HENT:  Negative for sore throat.   Respiratory:  Negative for cough.   Cardiovascular:  Positive for chest pain. Negative for  palpitations and leg swelling.  Gastrointestinal:  Negative for diarrhea and vomiting.  Genitourinary:  Negative for dysuria.  Musculoskeletal:  Negative for back pain and neck pain.  Skin:  Negative for rash.  Neurological:  Negative for headaches.    Updated Vital Signs BP 130/63   Pulse 70   Temp 97.6 F (36.4 C) (Oral)   Resp (!) 22   Ht 1.651 m (5' 5)   Wt 92.5 kg   SpO2 95%   BMI 33.95 kg/m   Physical Exam Vitals and nursing note reviewed.  Constitutional:      Appearance: Normal appearance. She is well-developed.   HENT:     Head: Atraumatic.     Nose: Nose normal.     Mouth/Throat:     Mouth: Mucous membranes are moist.  Eyes:     General: No scleral icterus.    Conjunctiva/sclera: Conjunctivae normal.  Neck:     Trachea: No tracheal deviation.  Cardiovascular:     Rate and Rhythm: Normal rate and regular rhythm.     Pulses: Normal pulses.     Heart sounds: Normal heart sounds. No murmur heard.    No friction rub. No gallop.  Pulmonary:     Effort: Pulmonary effort is normal. No respiratory distress.     Breath sounds: Wheezing present.     Comments: Mild wheezing Abdominal:     General: Bowel sounds are normal. There is no distension.     Palpations: Abdomen is soft. There is no mass.     Tenderness: There is no abdominal tenderness. There is no guarding or rebound.     Hernia: No hernia is present.  Genitourinary:    Comments: No cva tenderness.  Musculoskeletal:        General: No swelling or tenderness.     Cervical back: Normal range of motion and neck supple. No rigidity. No muscular tenderness.     Right lower leg: No edema.     Left lower leg: No edema.  Skin:    General: Skin is warm and dry.     Findings: No rash.     Comments: No skin lesions or rash in area of pain.   Neurological:     Mental Status: She is alert.     Comments: Alert, speech normal.   Psychiatric:        Mood and Affect: Mood normal.     (all labs ordered are listed, but only abnormal results are displayed) Results for orders placed or performed during the hospital encounter of 08/17/24  CBC   Collection Time: 08/17/24  8:30 AM  Result Value Ref Range   WBC 10.1 4.0 - 10.5 K/uL   RBC 3.77 (L) 3.87 - 5.11 MIL/uL   Hemoglobin 10.6 (L) 12.0 - 15.0 g/dL   HCT 66.5 (L) 63.9 - 53.9 %   MCV 88.6 80.0 - 100.0 fL   MCH 28.1 26.0 - 34.0 pg   MCHC 31.7 30.0 - 36.0 g/dL   RDW 86.2 88.4 - 84.4 %   Platelets 276 150 - 400 K/uL   nRBC 0.0 0.0 - 0.2 %  Comprehensive metabolic panel with GFR   Collection  Time: 08/17/24  8:30 AM  Result Value Ref Range   Sodium 139 135 - 145 mmol/L   Potassium 4.6 3.5 - 5.1 mmol/L   Chloride 104 98 - 111 mmol/L   CO2 22 22 - 32 mmol/L   Glucose, Bld 123 (H) 70 - 99 mg/dL  BUN 11 8 - 23 mg/dL   Creatinine, Ser 9.12 0.44 - 1.00 mg/dL   Calcium  9.5 8.9 - 10.3 mg/dL   Total Protein 7.4 6.5 - 8.1 g/dL   Albumin 4.3 3.5 - 5.0 g/dL   AST 27 15 - 41 U/L   ALT 13 0 - 44 U/L   Alkaline Phosphatase 83 38 - 126 U/L   Total Bilirubin 0.4 0.0 - 1.2 mg/dL   GFR, Estimated >39 >39 mL/min   Anion gap 13 5 - 15  Lipase, blood   Collection Time: 08/17/24  8:30 AM  Result Value Ref Range   Lipase 39 11 - 51 U/L  Troponin T, High Sensitivity   Collection Time: 08/17/24  8:30 AM  Result Value Ref Range   Troponin T High Sensitivity <15 0 - 19 ng/L    EKG: EKG Interpretation Date/Time:  Saturday August 17 2024 07:15:34 EST Ventricular Rate:  89 PR Interval:  164 QRS Duration:  93 QT Interval:  357 QTC Calculation: 435 R Axis:   64  Text Interpretation: Sinus rhythm Nonspecific T wave abnormality No significant change since last tracing Confirmed by Bernard Drivers ([id number]) on 08/17/2024 7:30:01 AM  Radiology: [facility] Chest 2 View Result Date: 08/17/2024 CLINICAL DATA:  chest pain EXAM: DG CHEST 2V COMPARISON:  August 17, 2018 FINDINGS: The cardiomediastinal silhouette is mildly enlarged in contour.Atherosclerotic calcifications. No pleural effusion. No pneumothorax. No acute pleuroparenchymal abnormality. Visualized abdomen is unremarkable. Multilevel degenerative changes of the thoracic spine. Mild compression fracture deformities at the thoracolumbar junction, favored slightly increased since prior. IMPRESSION: 1. No acute cardiopulmonary abnormality. 2. Mild compression fracture deformities at the thoracolumbar junction, favored slightly increased since prior. Recommend correlation with point tenderness. Electronically Signed   By: Corean Salter M.D.   On:  08/17/2024 09:16   US  Abdomen Limited RUQ (LIVER/GB) Result Date: 08/17/2024 EXAM: Right Upper Quadrant Abdominal Ultrasound 08/17/2024 08:51:53 AM TECHNIQUE: Real-time ultrasonography of the right upper quadrant of the abdomen was performed. COMPARISON: Chest CT 11/10/2022. CLINICAL HISTORY: 73 year old female with abdominal pain. FINDINGS: LIVER: The liver demonstrates normal echogenicity. Mild intrahepatic biliary ductal dilatation suspected. No evidence of mass. BILIARY SYSTEM: Confluent and shadowing gallstones within the gallbladder (series 1 image 4). Gallbladder wall thickness measures 0.2 cm, normal. No sonographic Murphy sign. No pericholecystic fluid. Abnormally dilated common bile duct, 8 to 9 mm. No visible filling defect within the bile duct (image 21 of series 1). OTHER: No right upper quadrant ascites. IMPRESSION: 1. Extensive Cholelithiasis, with Dilated common bile duct (9 mm) suspicious for choledocholithiasis. 2. No sonographic signs of acute cholecystitis. Electronically signed by: Helayne Hurst MD 08/17/2024 09:08 AM EST RP Workstation: [id number]     Procedures   Medications Ordered in the ED  morphine  (PF) 4 MG/ML injection 4 mg (has no administration in time range)  alum & mag hydroxide-simeth (MAALOX/MYLANTA) 200-200-20 MG/5ML suspension 30 mL (30 mLs Oral Given 08/17/24 0755)  acetaminophen  (TYLENOL ) tablet 1,000 mg (1,000 mg Oral Given 08/17/24 0755)  famotidine  (PEPCID ) tablet 20 mg (20 mg Oral Given 08/17/24 0755)  albuterol  (PROVENTIL ) (2.5 MG/3ML) 0.083% nebulizer solution 5 mg (5 mg Nebulization Given 08/17/24 0802)  ipratropium (ATROVENT ) nebulizer solution 0.5 mg (0.5 mg Nebulization Given 08/17/24 0802)  morphine  (PF) 4 MG/ML injection 4 mg (4 mg Intravenous Given 08/17/24 0831)  ondansetron  (ZOFRAN ) injection 4 mg (4 mg Intravenous Given 08/17/24 0830)  Medical Decision Making Problems Addressed: Atypical chest pain: acute  illness or injury with systemic symptoms Compression fracture of T12 vertebra with routine healing, subsequent encounter: chronic illness or injury Gallstones: acute illness or injury with systemic symptoms    Details: Acute/chronic RUQ abdominal pain: acute illness or injury with systemic symptoms that poses a threat to life or bodily functions  Amount and/or Complexity of Data Reviewed External Data Reviewed: notes. Labs: ordered. Decision-making details documented in ED Course. Radiology: ordered and independent interpretation performed. Decision-making details documented in ED Course. ECG/medicine tests: ordered and independent interpretation performed. Decision-making details documented in ED Course. Discussion of management or test interpretation with external provider(s): Gen surgery, medicine  Risk OTC drugs. Prescription drug management. Parenteral controlled substances. Decision regarding hospitalization.   Iv ns. Continuous pulse ox and cardiac monitoring. Labs ordered/sent. Imaging ordered.   Differential diagnosis includes ACS, msk cp, gi cp, abd process, etc. Dispo decision including potential need for admission considered - will get labs and imaging and reassess.   Reviewed nursing notes and prior charts for additional history. External reports reviewed. Hx mild-mod cad, prior cath did not require stent or other intervention. Prior cta neg for PE. Prior pulm eval for wheezing/sob noted.   Cardiac monitor: sinus rhythm, rate 74.  Meds for symptom relief. Albuterol  and atrovent  neb.   Labs reviewed/interpreted by me - wbc and hgb 10. Chem and lfts normal. Trop is normal - after constant symptoms for 8 hours, felt not c/w acs.   Xrays reviewed/interpreted by me - no pna.   U/s reviewed/interpreted by me - +gallstones.   Recheck pain improved from initial but persists, ruq. Additional pain meds given. Gen sur consulted.   Discussed pt and imaging with Dr Tanda - he  indicates can admit to hospitalists, indicates to get MRCP. They can see in consult.   Hospitalists consulted for admission. MRCP ordered, pending.          Final diagnoses:  RUQ abdominal pain  Atypical chest pain  Gallstones  Compression fracture of T12 vertebra with routine healing, subsequent encounter    ED Discharge Orders     None          Bernard Drivers, MD 08/17/24 1059

## 2024-08-17 NOTE — Consult Note (Signed)
 CC: stomach pain  Requesting provider: Dr Bernard  HPI: Diane Shaw is an 73 y.o. female who is here for evaluation for abdominal pain.  Patient has a past medical history significant for  non-obstructive CAD, HTN, IIDM, HLD, class I obesity, mild intermittent asthma who came to the emergency room because of acute onset of upper abdominal pain mainly in her right upper abdomen.  It woke her up this morning it was associated with nausea but later vomiting after trying to treat her pain with apple cider vinegar.  It was initially intermittent discomfort but was constant so she ended up coming to the emergency room.  Denies any fever chills or diarrhea.  In the emergency room she was found to have numerous gallstones on ultrasound as well as a dilated common bile duct of around 9 mm.  Denies prior symptoms.  No melena hematochezia.  Denies chest pain, chest pressure, shortness of breath, does get some shortness of breath with activity due to her asthma  No TIAs or amaurosis fugax  Past Medical History:  Diagnosis Date   Adrenal nodule    Alcohol use    Allergic rhinitis    Aortic atherosclerosis    Arthritis    RIGHT shoulder   Asthma    uses inhaler and nebulizer PRN   BMI 37.0-37.9, adult    CAD (coronary artery disease)    CHF (congestive heart failure) (HCC)    COPD (chronic obstructive pulmonary disease) (HCC)    GERD (gastroesophageal reflux disease)    diet controlled-OTC PRN   HTN (hypertension)    on meds   Hyperlipidemia    on meds   Hypertensive heart disease with CHF (congestive heart failure) (HCC)    Left ventricular hypertrophy    Obese    Peripheral vascular disease    Sickle cell trait    SVT (supraventricular tachycardia)    Trapezius muscle strain    Urinary frequency    Vitamin D deficiency     Past Surgical History:  Procedure Laterality Date   RIGHT/LEFT HEART CATH AND CORONARY ANGIOGRAPHY N/A 02/27/2024   Procedure: RIGHT/LEFT HEART CATH AND  CORONARY ANGIOGRAPHY;  Surgeon: Wendel Lurena POUR, MD;  Location: [facility];  Service: Cardiovascular;  Laterality: N/A;   TUBAL LIGATION      Family History  Problem Relation Age of Onset   Heart failure Sister    Diabetes Sister    Heart disease Sister        chf   Diabetes Mother    Hypertension Mother    Heart disease Mother        chf   Heart disease Father 51       MI   Hypertension Father    Heart disease Brother    Arthritis Sister    Heart disease Brother        Cabg   Colon polyps Brother 78   Stomach cancer Son 4   Colon cancer Neg Hx    Esophageal cancer Neg Hx    Rectal cancer Neg Hx     Social:  reports that she has never smoked. She has never used smokeless tobacco. She reports current alcohol use of about 3.0 standard drinks of alcohol per week. She reports that she does not use drugs.  Allergies: No Known Allergies  Medications: I have reviewed the patient's current medications.   ROS - all of the below systems have been reviewed with the patient and positives are indicated with  bold text General: chills, fever or night sweats Eyes: blurry vision or double vision ENT: epistaxis or sore throat Allergy/Immunology: itchy/watery eyes or nasal congestion Hematologic/Lymphatic: bleeding problems, blood clots or swollen lymph nodes Endocrine: temperature intolerance or unexpected weight changes Breast: new or changing breast lumps or nipple discharge Resp: cough, shortness of breath, or wheezing CV: chest pain or dyspnea on exertion GI: as per HPI GU: dysuria, trouble voiding, or hematuria MSK: joint pain or joint stiffness Neuro: TIA or stroke symptoms Derm: pruritus and skin lesion changes Psych: anxiety and depression  PE Blood pressure 124/66, pulse 73, temperature 97.7 F (36.5 C), temperature source Oral, resp. rate 14, height 5' 5 (1.651 m), weight 92.5 kg, SpO2 100%. Constitutional: NAD; conversant; no deformities Eyes: Moist  conjunctiva; no lid lag; anicteric; PERRL Neck: Trachea midline; no thyromegaly Lungs: Normal respiratory effort; no tactile fremitus CV: RRR; no palpable thrills; no pitting edema GI: Abd soft, nt, nd; no palpable hepatosplenomegaly MSK:  no clubbing/cyanosis Psychiatric: Appropriate affect; alert and oriented x3 Lymphatic: No palpable cervical or axillary lymphadenopathy Skin:no rash  Results for orders placed or performed during the hospital encounter of 08/17/24 (from the past 48 hours)  CBC     Status: Abnormal   Collection Time: 08/17/24  8:30 AM  Result Value Ref Range   WBC 10.1 4.0 - 10.5 K/uL   RBC 3.77 (L) 3.87 - 5.11 MIL/uL   Hemoglobin 10.6 (L) 12.0 - 15.0 g/dL   HCT 66.5 (L) 63.9 - 53.9 %   MCV 88.6 80.0 - 100.0 fL   MCH 28.1 26.0 - 34.0 pg   MCHC 31.7 30.0 - 36.0 g/dL   RDW 86.2 88.4 - 84.4 %   Platelets 276 150 - 400 K/uL   nRBC 0.0 0.0 - 0.2 %    Comment: Performed at [facility], [address], [place], [place] [address]  Troponin T, High Sensitivity     Status: None   Collection Time: 08/17/24  8:30 AM  Result Value Ref Range   Troponin T High Sensitivity <15 0 - 19 ng/L    Comment: (NOTE) Biotin concentrations > 1000 ng/mL falsely decrease TnT results.  Serial cardiac troponin measurements are suggested.  Refer to the Links section for chest pain algorithms and additional  guidance. Performed at [facility], [address] [place], [place] [address]   Comprehensive metabolic panel with GFR     Status: Abnormal   Collection Time: 08/17/24  8:30 AM  Result Value Ref Range   Sodium 139 135 - 145 mmol/L   Potassium 4.6 3.5 - 5.1 mmol/L    Comment: HEMOLYSIS AT THIS LEVEL MAY AFFECT RESULT   Chloride 104 98 - 111 mmol/L   CO2 22 22 - 32 mmol/L   Glucose, Bld 123 (H) 70 - 99 mg/dL    Comment: Glucose reference range applies only to samples taken after fasting for at least 8 hours.   BUN 11 8 - 23 mg/dL    Creatinine, Ser 9.12 0.44 - 1.00 mg/dL   Calcium  9.5 8.9 - 10.3 mg/dL   Total Protein 7.4 6.5 - 8.1 g/dL   Albumin 4.3 3.5 - 5.0 g/dL   AST 27 15 - 41 U/L    Comment: HEMOLYSIS AT THIS LEVEL MAY AFFECT RESULT   ALT 13 0 - 44 U/L   Alkaline Phosphatase 83 38 - 126 U/L   Total Bilirubin 0.4 0.0 - 1.2 mg/dL   GFR, Estimated >39 >39 mL/min  Comment: (NOTE) Calculated using the CKD-EPI Creatinine Equation (2021)    Anion gap 13 5 - 15    Comment: Performed at [facility], [address] [place], [place] [address]  Lipase, blood     Status: None   Collection Time: 08/17/24  8:30 AM  Result Value Ref Range   Lipase 39 11 - 51 U/L    Comment: Performed at [facility], [address] [place], [place] [address]  Troponin T, High Sensitivity     Status: None   Collection Time: 08/17/24 10:47 AM  Result Value Ref Range   Troponin T High Sensitivity <15 0 - 19 ng/L    Comment: (NOTE) Biotin concentrations > 1000 ng/mL falsely decrease TnT results.  Serial cardiac troponin measurements are suggested.  Refer to the Links section for chest pain algorithms and additional  guidance. Performed at [facility], [address] [place], [place] [address]   Glucose, capillary     Status: None   Collection Time: 08/17/24  1:00 PM  Result Value Ref Range   Glucose-Capillary 93 70 - 99 mg/dL    Comment: Glucose reference range applies only to samples taken after fasting for at least 8 hours.  Hemoglobin A1c     Status: None   Collection Time: 08/17/24  1:15 PM  Result Value Ref Range   Hgb A1c MFr Bld 5.0 4.8 - 5.6 %    Comment: (NOTE) Diagnosis of Diabetes The following HbA1c ranges recommended by the American Diabetes Association (ADA) may be used as an aid in the diagnosis of diabetes mellitus.  Hemoglobin             Suggested A1C NGSP%              Diagnosis  <5.7                   Non Diabetic  5.7-6.4                 Pre-Diabetic  >6.4                   Diabetic  <7.0                   Glycemic control for                       adults with diabetes.     Mean Plasma Glucose 96.8 mg/dL    Comment: Performed at [facility], [address] [place], [place] [address]  Glucose, capillary     Status: None   Collection Time: 08/17/24  4:31 PM  Result Value Ref Range   Glucose-Capillary 79 70 - 99 mg/dL    Comment: Glucose reference range applies only to samples taken after fasting for at least 8 hours.    MR 3D Recon At Scanner Result Date: 08/17/2024 XAM: MRCP WITH AND WITHOUT IV CONTRAST 08/17/2024 02: 49:11 PM TECHNIQUE: Multisequence, multiplanar magnetic resonance images of the abdomen with and without intravenous contrast. MRCP sequences were performed. 10 mL gadobutrol  (GADAVIST ) 1 MMOL/ML injection was administered. COMPARISON: Ultrasound 08/17/2024. CLINICAL HISTORY: Cholelithiasis; r/o CBD stone. FINDINGS: LIVER: Unremarkable. GALLBLADDER AND BILIARY SYSTEM: There are multiple small 1 mm stones within the fundus of the gallbladder which are change numerous to count There is a larger 18 mm faceted stone towards the neck of the gallbladder. The gallbladder is nondistended measuring 3 cm in  diameter. No gallbladder wall thickening and pericholecystic fluid. The common bile duct is mildly dilated at 5 to 10 mm. The common bile duct tapers to the ampulla without obstructing calculus. No intrahepatic ductal dilatation. SPLEEN: Unremarkable. PANCREAS/PANCREATIC DUCT: There is mild pancreatic ductal dilatation throughout the head, body, and tail of the pancreas. The pancreatic duct measures 5 mm on image 15/3. No acute pancreatic inflammation. ADRENAL GLANDS: Unremarkable. KIDNEYS: There are bilateral fluid intensity cysts within the left and right kidneys. These show no postcontrast enhancement, consistent with benign renal cysts. No hydronephrosis. LYMPH NODES: No enlarged abdominal lymph nodes.  VASCULATURE: Unremarkable. PERITONEUM: No ascites. ABDOMINAL WALL: No hernia. No mass. BOWEL: Grossly unremarkable. No bowel obstruction. BONES: No acute abnormality or worrisome osseous lesion. SOFT TISSUES: Unremarkable. MISCELLANEOUS: Unremarkable. IMPRESSION: 1. Cholelithiasis with multiple small gallstones and an 18 mm gallstone near the gallbladder neck, without imaging signs of acute cholecystitis. 2. Mild dilation of the common bile duct without an obstructing calculus identified. 3. Mild diffuse pancreatic ductal dilatation without acute pancreatitis. . Electronically signed by: Norleen Boxer MD 08/17/2024 03:06 PM EST RP Workstation: [id number]   MR ABDOMEN MRCP W WO CONTAST Result Date: 08/17/2024 EXAM: MRCP WITH AND WITHOUT IV CONTRAST 08/17/2024 02:49:11 PM TECHNIQUE: Multisequence, multiplanar magnetic resonance images of the abdomen with and without intravenous contrast. MRCP sequences were performed. 10 mL gadobutrol  (GADAVIST ) 1 MMOL/ML injection was administered. COMPARISON: Ultrasound 08/17/2024. CLINICAL HISTORY: Cholelithiasis; r/o CBD stone. FINDINGS: LIVER: Unremarkable. GALLBLADDER AND BILIARY SYSTEM: There are multiple small 1 mm stones within the fundus of the gallbladder which are change numerous to count There is a larger 18 mm faceted stone towards the neck of the gallbladder. The gallbladder is nondistended measuring 3 cm in diameter. No gallbladder wall thickening and pericholecystic fluid. The common bile duct is mildly dilated at 5 to 10 mm. The common bile duct tapers to the ampulla without obstructing calculus. No intrahepatic ductal dilatation. SPLEEN: Unremarkable. PANCREAS/PANCREATIC DUCT: There is mild pancreatic ductal dilatation throughout the head, body, and tail of the pancreas. The pancreatic duct measures 5 mm on image 15/3. No acute pancreatic inflammation. ADRENAL GLANDS: Unremarkable. KIDNEYS: There are bilateral fluid intensity cysts within the left and right  kidneys. These show no postcontrast enhancement, consistent with benign renal cysts. No hydronephrosis. LYMPH NODES: No enlarged abdominal lymph nodes. VASCULATURE: Unremarkable. PERITONEUM: No ascites. ABDOMINAL WALL: No hernia. No mass. BOWEL: Grossly unremarkable. No bowel obstruction. BONES: No acute abnormality or worrisome osseous lesion. SOFT TISSUES: Unremarkable. MISCELLANEOUS: Unremarkable. IMPRESSION: 1. Cholelithiasis with multiple small gallstones and an 18 mm gallstone near the gallbladder neck, without imaging signs of acute cholecystitis. 2. Mild dilation of the common bile duct without an obstructing calculus identified. 3. Mild diffuse pancreatic ductal dilatation without acute pancreatitis. Electronically signed by: Norleen Boxer MD 08/17/2024 03:01 PM EST RP Workstation: [id number]   DG Chest 2 View Result Date: 08/17/2024 CLINICAL DATA:  chest pain EXAM: DG CHEST 2V COMPARISON:  August 17, 2018 FINDINGS: The cardiomediastinal silhouette is mildly enlarged in contour.Atherosclerotic calcifications. No pleural effusion. No pneumothorax. No acute pleuroparenchymal abnormality. Visualized abdomen is unremarkable. Multilevel degenerative changes of the thoracic spine. Mild compression fracture deformities at the thoracolumbar junction, favored slightly increased since prior. IMPRESSION: 1. No acute cardiopulmonary abnormality. 2. Mild compression fracture deformities at the thoracolumbar junction, favored slightly increased since prior. Recommend correlation with point tenderness. Electronically Signed   By: Corean Salter M.D.   On: 08/17/2024 09:16   US  Abdomen Limited RUQ (LIVER/GB) Result Date:  08/17/2024 EXAM: Right Upper Quadrant Abdominal Ultrasound 08/17/2024 08:51:53 AM TECHNIQUE: Real-time ultrasonography of the right upper quadrant of the abdomen was performed. COMPARISON: Chest CT 11/10/2022. CLINICAL HISTORY: 73 year old female with abdominal pain. FINDINGS: LIVER: The liver  demonstrates normal echogenicity. Mild intrahepatic biliary ductal dilatation suspected. No evidence of mass. BILIARY SYSTEM: Confluent and shadowing gallstones within the gallbladder (series 1 image 4). Gallbladder wall thickness measures 0.2 cm, normal. No sonographic Murphy sign. No pericholecystic fluid. Abnormally dilated common bile duct, 8 to 9 mm. No visible filling defect within the bile duct (image 21 of series 1). OTHER: No right upper quadrant ascites. IMPRESSION: 1. Extensive Cholelithiasis, with Dilated common bile duct (9 mm) suspicious for choledocholithiasis. 2. No sonographic signs of acute cholecystitis. Electronically signed by: Helayne Hurst MD 08/17/2024 09:08 AM EST RP Workstation: [id number]    Imaging: Personally reviewed  A/P: Diane Shaw is an 73 y.o. female with  Symptomatic cholelithiasis Dilated common bile duct Chronic anemia of unclear etiology  non-obstructive CAD,  HTN,  IIDM,  HLD,  Class 1 obesity  mild intermittent asthma   Symptomatic cholelithiasis MRI negative for choledocholithiasis Planned laparoscopic cholecystectomy in a.m. Full liquids tonight, n.p.o. except meds after midnight IV antibiotic on-call to the OR Will give preoperative ICG dye  I believe the patient's symptoms are consistent with gallbladder disease.  We discussed gallbladder disease.   I discussed laparoscopic cholecystectomy in detail.  The patient was shown diagrams detailing the procedure.  We discussed the risks and benefits of a laparoscopic cholecystectomy including, but not limited to bleeding, infection, injury to surrounding structures such as the intestine or liver, bile leak, retained gallstones, need to convert to an open procedure, prolonged diarrhea, blood clots such as  DVT, common bile duct injury, anesthesia risks, and possible need for additional procedures.  We discussed the typical post-operative recovery course. I explained that the likelihood of  improvement of their symptoms is good.  High MDM  Data reviewed: Reviewed ED notes, MRI/MRCP labs from this morning as well as trended her CBCs for the past 6 months, hospitalist H&P, pulmonary office note from September 5, cardiology office note from September night  Camellia HERO. Tanda, MD, FACS General, Bariatric, & Minimally Invasive Surgery [facility]

## 2024-08-18 ENCOUNTER — Encounter (HOSPITAL_COMMUNITY)
Admission: EM | Disposition: A | Discharge status: Home / Self Care | Payer: Self-pay | Source: Home / Self Care | Attending: Internal Medicine

## 2024-08-18 ENCOUNTER — Inpatient Hospital Stay (HOSPITAL_COMMUNITY): Admitting: Certified Registered"

## 2024-08-18 DIAGNOSIS — K838 Other specified diseases of biliary tract: Secondary | ICD-10-CM | POA: Diagnosis not present

## 2024-08-18 DIAGNOSIS — I2510 Atherosclerotic heart disease of native coronary artery without angina pectoris: Secondary | ICD-10-CM

## 2024-08-18 DIAGNOSIS — I509 Heart failure, unspecified: Secondary | ICD-10-CM | POA: Diagnosis not present

## 2024-08-18 DIAGNOSIS — R1011 Right upper quadrant pain: Secondary | ICD-10-CM

## 2024-08-18 DIAGNOSIS — K8050 Calculus of bile duct without cholangitis or cholecystitis without obstruction: Secondary | ICD-10-CM

## 2024-08-18 DIAGNOSIS — I110 Hypertensive heart disease with heart failure: Secondary | ICD-10-CM

## 2024-08-18 DIAGNOSIS — K8060 Calculus of gallbladder and bile duct with cholecystitis, unspecified, without obstruction: Secondary | ICD-10-CM

## 2024-08-18 DIAGNOSIS — K8020 Calculus of gallbladder without cholecystitis without obstruction: Secondary | ICD-10-CM | POA: Diagnosis not present

## 2024-08-18 HISTORY — PX: CHOLECYSTECTOMY: SHX55

## 2024-08-18 LAB — TYPE AND SCREEN
ABO/RH(D): O POS
Antibody Screen: NEGATIVE

## 2024-08-18 LAB — CBC
HCT: 29.9 % — ABNORMAL LOW (ref 36.0–46.0)
Hemoglobin: 9.5 g/dL — ABNORMAL LOW (ref 12.0–15.0)
MCH: 27.9 pg (ref 26.0–34.0)
MCHC: 31.8 g/dL (ref 30.0–36.0)
MCV: 87.7 fL (ref 80.0–100.0)
Platelets: 253 K/uL (ref 150–400)
RBC: 3.41 MIL/uL — ABNORMAL LOW (ref 3.87–5.11)
RDW: 13.8 % (ref 11.5–15.5)
WBC: 7.1 K/uL (ref 4.0–10.5)
nRBC: 0 % (ref 0.0–0.2)

## 2024-08-18 LAB — COMPREHENSIVE METABOLIC PANEL WITH GFR
ALT: 12 U/L (ref 0–44)
AST: 16 U/L (ref 15–41)
Albumin: 3.9 g/dL (ref 3.5–5.0)
Alkaline Phosphatase: 73 U/L (ref 38–126)
Anion gap: 9 (ref 5–15)
BUN: 9 mg/dL (ref 8–23)
CO2: 26 mmol/L (ref 22–32)
Calcium: 9.1 mg/dL (ref 8.9–10.3)
Chloride: 106 mmol/L (ref 98–111)
Creatinine, Ser: 0.94 mg/dL (ref 0.44–1.00)
GFR, Estimated: >60 mL/min
Glucose, Bld: 83 mg/dL (ref 70–99)
Potassium: 4 mmol/L (ref 3.5–5.1)
Sodium: 141 mmol/L (ref 135–145)
Total Bilirubin: 0.5 mg/dL (ref 0.0–1.2)
Total Protein: 6.6 g/dL (ref 6.5–8.1)

## 2024-08-18 LAB — GLUCOSE, CAPILLARY
Glucose-Capillary: 89 mg/dL (ref 70–99)
Glucose-Capillary: 163 mg/dL — ABNORMAL HIGH (ref 70–99)
Glucose-Capillary: 163 mg/dL — ABNORMAL HIGH (ref 70–99)

## 2024-08-18 LAB — SURGICAL PCR SCREEN
MRSA, PCR: NEGATIVE
Staphylococcus aureus: NEGATIVE

## 2024-08-18 LAB — ABO/RH: ABO/RH(D): O POS

## 2024-08-18 SURGERY — LAPAROSCOPIC CHOLECYSTECTOMY
Anesthesia: General | Site: Abdomen

## 2024-08-18 MED ORDER — CEFAZOLIN SODIUM 1 G IJ SOLR
INTRAMUSCULAR | Status: AC
  Filled 2024-08-18: qty 20

## 2024-08-18 MED ORDER — FENTANYL CITRATE (PF) 100 MCG/2ML IJ SOLN
INTRAMUSCULAR | Status: DC | PRN
  Administered 2024-08-18 (×2): 50 ug via INTRAVENOUS
  Administered 2024-08-18: 100 ug via INTRAVENOUS
  Administered 2024-08-18: 50 ug via INTRAVENOUS

## 2024-08-18 MED ORDER — ONDANSETRON HCL 4 MG/2ML IJ SOLN
INTRAMUSCULAR | Status: AC
Start: 2024-08-18 — End: 2024-08-18
  Filled 2024-08-18: qty 2

## 2024-08-18 MED ORDER — PROPOFOL 10 MG/ML IV BOLUS
INTRAVENOUS | Status: DC | PRN
  Administered 2024-08-18: 150 mg via INTRAVENOUS

## 2024-08-18 MED ORDER — FENTANYL CITRATE (PF) 50 MCG/ML IJ SOSY: 25.0000 ug | PREFILLED_SYRINGE | INTRAMUSCULAR | Status: DC | PRN

## 2024-08-18 MED ORDER — ENOXAPARIN SODIUM 60 MG/0.6ML IJ SOSY
50.0000 mg | PREFILLED_SYRINGE | INTRAMUSCULAR | Status: DC
  Administered 2024-08-19: 50 mg via SUBCUTANEOUS
  Filled 2024-08-18: qty 0.6

## 2024-08-18 MED ORDER — OXYCODONE HCL 5 MG PO TABS
5.0000 mg | ORAL_TABLET | ORAL | Status: DC | PRN
  Administered 2024-08-18 (×2): 5 mg via ORAL
  Filled 2024-08-18 (×2): qty 1

## 2024-08-18 MED ORDER — ACETAMINOPHEN 500 MG PO TABS
1000.0000 mg | ORAL_TABLET | Freq: Three times a day (TID) | ORAL | Status: DC
  Administered 2024-08-18 – 2024-08-19 (×3): 1000 mg via ORAL
  Filled 2024-08-18 (×3): qty 2

## 2024-08-18 MED ORDER — SODIUM CHLORIDE (PF) 0.9 % IJ SOLN
INTRAMUSCULAR | Status: AC
Start: 2024-08-18 — End: 2024-08-18
  Filled 2024-08-18: qty 10

## 2024-08-18 MED ORDER — ASPIRIN 81 MG PO TBEC
81.0000 mg | DELAYED_RELEASE_TABLET | Freq: Every day | ORAL | Status: DC
  Administered 2024-08-19: 81 mg via ORAL
  Filled 2024-08-18: qty 1

## 2024-08-18 MED ORDER — PROPOFOL 10 MG/ML IV BOLUS
INTRAVENOUS | Status: AC
  Filled 2024-08-18: qty 20

## 2024-08-18 MED ORDER — ONDANSETRON HCL 4 MG/2ML IJ SOLN
INTRAMUSCULAR | Status: DC | PRN
  Administered 2024-08-18: 4 mg via INTRAVENOUS

## 2024-08-18 MED ORDER — LIDOCAINE HCL (CARDIAC) PF 100 MG/5ML IV SOSY
PREFILLED_SYRINGE | INTRAVENOUS | Status: DC | PRN
  Administered 2024-08-18: 50 mg via INTRATRACHEAL

## 2024-08-18 MED ORDER — LACTATED RINGERS IV SOLN
INTRAVENOUS | Status: DC | PRN
Start: 2024-08-18 — End: 2024-08-18

## 2024-08-18 MED ORDER — SUGAMMADEX SODIUM 200 MG/2ML IV SOLN
INTRAVENOUS | Status: DC | PRN
  Administered 2024-08-18: 200 mg via INTRAVENOUS

## 2024-08-18 MED ORDER — BUPIVACAINE-EPINEPHRINE (PF) 0.25% -1:200000 IJ SOLN
INTRAMUSCULAR | Status: AC
  Filled 2024-08-18: qty 30

## 2024-08-18 MED ORDER — MUPIROCIN 2 % EX OINT
1.0000 | TOPICAL_OINTMENT | Freq: Two times a day (BID) | CUTANEOUS | Status: DC
  Administered 2024-08-18 – 2024-08-19 (×2): 1 via NASAL
  Filled 2024-08-18: qty 22

## 2024-08-18 MED ORDER — EPHEDRINE SULFATE (PRESSORS) 25 MG/5ML IV SOSY
PREFILLED_SYRINGE | INTRAVENOUS | Status: DC | PRN
  Administered 2024-08-18: 10 mg via INTRAVENOUS

## 2024-08-18 MED ORDER — FENTANYL CITRATE (PF) 250 MCG/5ML IJ SOLN
INTRAMUSCULAR | Status: AC
  Filled 2024-08-18: qty 5

## 2024-08-18 MED ORDER — DROPERIDOL 2.5 MG/ML IJ SOLN: 0.6250 mg | Freq: Once | INTRAMUSCULAR | Status: DC | PRN

## 2024-08-18 MED ORDER — ALBUTEROL SULFATE (2.5 MG/3ML) 0.083% IN NEBU
INHALATION_SOLUTION | RESPIRATORY_TRACT | Status: AC
  Filled 2024-08-18: qty 3

## 2024-08-18 MED ORDER — ROCURONIUM BROMIDE 10 MG/ML (PF) SYRINGE
PREFILLED_SYRINGE | INTRAVENOUS | Status: DC | PRN
  Administered 2024-08-18: 60 mg via INTRAVENOUS

## 2024-08-18 MED ORDER — DEXAMETHASONE SOD PHOSPHATE PF 10 MG/ML IJ SOLN
INTRAMUSCULAR | Status: DC | PRN
  Administered 2024-08-18: 4 mg via INTRAVENOUS

## 2024-08-18 MED ORDER — LIDOCAINE HCL (PF) 2 % IJ SOLN
INTRAMUSCULAR | Status: AC
  Filled 2024-08-18: qty 5

## 2024-08-18 MED ORDER — LACTATED RINGERS IR SOLN
Status: DC | PRN
  Administered 2024-08-18: 1000 mL

## 2024-08-18 MED ORDER — EPHEDRINE 5 MG/ML INJ
INTRAVENOUS | Status: AC
  Filled 2024-08-18: qty 5

## 2024-08-18 MED ORDER — BUPIVACAINE-EPINEPHRINE 0.25% -1:200000 IJ SOLN
INTRAMUSCULAR | Status: DC | PRN
  Administered 2024-08-18: 20 mL

## 2024-08-18 MED ORDER — CEFAZOLIN SODIUM-DEXTROSE 2-4 GM/100ML-% IV SOLN
INTRAVENOUS | Status: AC
  Filled 2024-08-18: qty 100

## 2024-08-18 MED ORDER — ALBUTEROL SULFATE (2.5 MG/3ML) 0.083% IN NEBU
2.5000 mg | INHALATION_SOLUTION | RESPIRATORY_TRACT | Status: DC | PRN
  Administered 2024-08-18: 2.5 mg via RESPIRATORY_TRACT

## 2024-08-18 SURGICAL SUPPLY — 42 items
APPLICATOR ARISTA FLEXITIP XL (MISCELLANEOUS) IMPLANT
BAG COUNTER SPONGE SURGICOUNT (BAG) IMPLANT
CHLORAPREP W/TINT 26 (MISCELLANEOUS) ×2 IMPLANT
CLIP APPLIE 5 13 M/L LIGAMAX5 (MISCELLANEOUS) IMPLANT
CLIP APPLIE ROT 10 11.4 M/L (STAPLE) IMPLANT
CLIP LIGATING HEMO O LOK GREEN (MISCELLANEOUS) IMPLANT
CLSR STERI-STRIP ANTIMIC 1/2X4 (GAUZE/BANDAGES/DRESSINGS) IMPLANT
COVER MAYO STAND XLG (MISCELLANEOUS) IMPLANT
COVER SURGICAL LIGHT HANDLE (MISCELLANEOUS) ×2 IMPLANT
DERMABOND ADVANCED .7 DNX12 (GAUZE/BANDAGES/DRESSINGS) IMPLANT
DRAPE C-ARM 42X120 X-RAY (DRAPES) IMPLANT
DRSG TEGADERM 2-3/8X2-3/4 SM (GAUZE/BANDAGES/DRESSINGS) ×6 IMPLANT
DRSG TEGADERM 4X4.75 (GAUZE/BANDAGES/DRESSINGS) ×2 IMPLANT
ELECT REM PT RETURN 15FT ADLT (MISCELLANEOUS) ×2 IMPLANT
GAUZE SPONGE 2X2 8PLY STRL LF (GAUZE/BANDAGES/DRESSINGS) ×2 IMPLANT
GLOVE BIO SURGEON STRL SZ7.5 (GLOVE) ×2 IMPLANT
GLOVE INDICATOR 8.0 STRL GRN (GLOVE) ×2 IMPLANT
GOWN STRL REUS W/ TWL XL LVL3 (GOWN DISPOSABLE) ×2 IMPLANT
GRASPER SUT TROCAR 14GX15 (MISCELLANEOUS) IMPLANT
HEMOSTAT ARISTA ABSORB 3G PWDR (HEMOSTASIS) IMPLANT
HEMOSTAT SNOW SURGICEL 2X4 (HEMOSTASIS) IMPLANT
IRRIGATION SUCT STRKRFLW 2 WTP (MISCELLANEOUS) ×2 IMPLANT
KIT BASIN OR (CUSTOM PROCEDURE TRAY) ×2 IMPLANT
KIT IMAGING PINPOINTPAQ (MISCELLANEOUS) IMPLANT
KIT TURNOVER KIT A (KITS) ×2 IMPLANT
POUCH RETRIEVAL ECOSAC 10 (ENDOMECHANICALS) ×2 IMPLANT
SCISSORS LAP 5X35 DISP (ENDOMECHANICALS) ×2 IMPLANT
SET CHOLANGIOGRAPH MIX (MISCELLANEOUS) IMPLANT
SET TUBE SMOKE EVAC HIGH FLOW (TUBING) ×2 IMPLANT
SLEEVE ADV FIXATION 5X100MM (TROCAR) ×2 IMPLANT
SPIKE FLUID TRANSFER (MISCELLANEOUS) ×2 IMPLANT
STRIP CLOSURE SKIN 1/2X4 (GAUZE/BANDAGES/DRESSINGS) ×2 IMPLANT
SUT MNCRL AB 4-0 PS2 18 (SUTURE) ×2 IMPLANT
SUT VIC AB 0 UR5 27 (SUTURE) IMPLANT
SUT VICRYL 0 TIES 12 18 (SUTURE) IMPLANT
SUT VICRYL 0 UR6 27IN ABS (SUTURE) ×2 IMPLANT
TOWEL OR DSP ST BLU DLX 10/PK (DISPOSABLE) ×2 IMPLANT
TRAY LAPAROSCOPIC (CUSTOM PROCEDURE TRAY) ×2 IMPLANT
TROCAR ADV FIXATION 12X100MM (TROCAR) IMPLANT
TROCAR ADV FIXATION 5X100MM (TROCAR) ×2 IMPLANT
TROCAR BALLN 12MMX100 BLUNT (TROCAR) IMPLANT
TROCAR XCEL NON-BLD 5MMX100MML (ENDOMECHANICALS) IMPLANT

## 2024-08-18 NOTE — Progress Notes (Signed)
 [facility] PROGRESS NOTE    Progress Note  Diane Shaw  FMW:[id number] DOB: 1951/04/04 DOA: 08/17/2024 PCP: Campbell Reynolds, NP     Brief Narrative:   Diane Shaw is an 73 y.o. female past medical history significant for nonobstructive CAD, essential hypertension morbid obesity intermittent asthma comes in complaining of abdominal pain, right upper quadrant ultrasound showed gallstone dilated CBD AST and ALT were elevated General Surgery was consulted recommended an MRCP     Assessment/Plan:   Cholelithiasis MRCP showed a 2 mm gallstone near the gallbladder neck with no signs of acute cholecystitis mild dilation of the common bile duct without obstructing calculi, mild pancreatic duct dilation with out acute pancreatitis. LFTs are unremarkable. Surgery discussed with the patient as per patient she is going for lap chole on 08/18/2024  Essential hypertension: Blood pressure borderline holding antihypertensive medication except metoprolol . Continue metoprolol .  Insulin -dependent diabetes mellitus: Currently n.p.o. oral hypoglycemic agents are held. Continue sliding scale insulin .  Nonobstructive CAD: Continue aspirin  and statins.  Obesity: Noted the patient is on Ozempic as an outpatient.  Mild intermittent asthma: Not in exacerbation continue inhalers.   DVT prophylaxis: lovenox  Family Communication:none Status is: Inpatient Remains inpatient appropriate because: Cholelithiasis    Code Status:     Code Status Orders  (From admission, onward)           Start     Ordered   08/17/24 1132  Full code  Continuous       Question:  By:  Answer:  Consent: discussion documented in EHR   08/17/24 1132           Code Status History     Date Active Date Inactive Code Status Order ID Comments User Context   02/27/2024 0859 02/28/2024 1754 Full Code [id number]  Wendel Lurena POUR, MD Inpatient   01/27/2012 0233 01/30/2012 1357 Full Code [id number]  Eilleen Camellia Cough, RN ED         IV Access:   Peripheral IV   Procedures and diagnostic studies:   MR 3D Recon At Scanner Result Date: 08/17/2024 XAM: MRCP WITH AND WITHOUT IV CONTRAST 08/17/2024 02: 49:11 PM TECHNIQUE: Multisequence, multiplanar magnetic resonance images of the abdomen with and without intravenous contrast. MRCP sequences were performed. 10 mL gadobutrol  (GADAVIST ) 1 MMOL/ML injection was administered. COMPARISON: Ultrasound 08/17/2024. CLINICAL HISTORY: Cholelithiasis; r/o CBD stone. FINDINGS: LIVER: Unremarkable. GALLBLADDER AND BILIARY SYSTEM: There are multiple small 1 mm stones within the fundus of the gallbladder which are change numerous to count There is a larger 18 mm faceted stone towards the neck of the gallbladder. The gallbladder is nondistended measuring 3 cm in diameter. No gallbladder wall thickening and pericholecystic fluid. The common bile duct is mildly dilated at 5 to 10 mm. The common bile duct tapers to the ampulla without obstructing calculus. No intrahepatic ductal dilatation. SPLEEN: Unremarkable. PANCREAS/PANCREATIC DUCT: There is mild pancreatic ductal dilatation throughout the head, body, and tail of the pancreas. The pancreatic duct measures 5 mm on image 15/3. No acute pancreatic inflammation. ADRENAL GLANDS: Unremarkable. KIDNEYS: There are bilateral fluid intensity cysts within the left and right kidneys. These show no postcontrast enhancement, consistent with benign renal cysts. No hydronephrosis. LYMPH NODES: No enlarged abdominal lymph nodes. VASCULATURE: Unremarkable. PERITONEUM: No ascites. ABDOMINAL WALL: No hernia. No mass. BOWEL: Grossly unremarkable. No bowel obstruction. BONES: No acute abnormality or worrisome osseous lesion. SOFT TISSUES: Unremarkable. MISCELLANEOUS: Unremarkable. IMPRESSION: 1. Cholelithiasis with multiple small gallstones and an 18 mm gallstone near  the gallbladder neck, without imaging signs of acute cholecystitis. 2.  Mild dilation of the common bile duct without an obstructing calculus identified. 3. Mild diffuse pancreatic ductal dilatation without acute pancreatitis. . Electronically signed by: Norleen Boxer MD 08/17/2024 03:06 PM EST RP Workstation: [id number]   MR ABDOMEN MRCP W WO CONTAST Result Date: 08/17/2024 EXAM: MRCP WITH AND WITHOUT IV CONTRAST 08/17/2024 02:49:11 PM TECHNIQUE: Multisequence, multiplanar magnetic resonance images of the abdomen with and without intravenous contrast. MRCP sequences were performed. 10 mL gadobutrol  (GADAVIST ) 1 MMOL/ML injection was administered. COMPARISON: Ultrasound 08/17/2024. CLINICAL HISTORY: Cholelithiasis; r/o CBD stone. FINDINGS: LIVER: Unremarkable. GALLBLADDER AND BILIARY SYSTEM: There are multiple small 1 mm stones within the fundus of the gallbladder which are change numerous to count There is a larger 18 mm faceted stone towards the neck of the gallbladder. The gallbladder is nondistended measuring 3 cm in diameter. No gallbladder wall thickening and pericholecystic fluid. The common bile duct is mildly dilated at 5 to 10 mm. The common bile duct tapers to the ampulla without obstructing calculus. No intrahepatic ductal dilatation. SPLEEN: Unremarkable. PANCREAS/PANCREATIC DUCT: There is mild pancreatic ductal dilatation throughout the head, body, and tail of the pancreas. The pancreatic duct measures 5 mm on image 15/3. No acute pancreatic inflammation. ADRENAL GLANDS: Unremarkable. KIDNEYS: There are bilateral fluid intensity cysts within the left and right kidneys. These show no postcontrast enhancement, consistent with benign renal cysts. No hydronephrosis. LYMPH NODES: No enlarged abdominal lymph nodes. VASCULATURE: Unremarkable. PERITONEUM: No ascites. ABDOMINAL WALL: No hernia. No mass. BOWEL: Grossly unremarkable. No bowel obstruction. BONES: No acute abnormality or worrisome osseous lesion. SOFT TISSUES: Unremarkable. MISCELLANEOUS: Unremarkable.  IMPRESSION: 1. Cholelithiasis with multiple small gallstones and an 18 mm gallstone near the gallbladder neck, without imaging signs of acute cholecystitis. 2. Mild dilation of the common bile duct without an obstructing calculus identified. 3. Mild diffuse pancreatic ductal dilatation without acute pancreatitis. Electronically signed by: Norleen Boxer MD 08/17/2024 03:01 PM EST RP Workstation: [id number]   DG Chest 2 View Result Date: 08/17/2024 CLINICAL DATA:  chest pain EXAM: DG CHEST 2V COMPARISON:  August 17, 2018 FINDINGS: The cardiomediastinal silhouette is mildly enlarged in contour.Atherosclerotic calcifications. No pleural effusion. No pneumothorax. No acute pleuroparenchymal abnormality. Visualized abdomen is unremarkable. Multilevel degenerative changes of the thoracic spine. Mild compression fracture deformities at the thoracolumbar junction, favored slightly increased since prior. IMPRESSION: 1. No acute cardiopulmonary abnormality. 2. Mild compression fracture deformities at the thoracolumbar junction, favored slightly increased since prior. Recommend correlation with point tenderness. Electronically Signed   By: Corean Salter M.D.   On: 08/17/2024 09:16   US  Abdomen Limited RUQ (LIVER/GB) Result Date: 08/17/2024 EXAM: Right Upper Quadrant Abdominal Ultrasound 08/17/2024 08:51:53 AM TECHNIQUE: Real-time ultrasonography of the right upper quadrant of the abdomen was performed. COMPARISON: Chest CT 11/10/2022. CLINICAL HISTORY: 73 year old female with abdominal pain. FINDINGS: LIVER: The liver demonstrates normal echogenicity. Mild intrahepatic biliary ductal dilatation suspected. No evidence of mass. BILIARY SYSTEM: Confluent and shadowing gallstones within the gallbladder (series 1 image 4). Gallbladder wall thickness measures 0.2 cm, normal. No sonographic Murphy sign. No pericholecystic fluid. Abnormally dilated common bile duct, 8 to 9 mm. No visible filling defect within the bile  duct (image 21 of series 1). OTHER: No right upper quadrant ascites. IMPRESSION: 1. Extensive Cholelithiasis, with Dilated common bile duct (9 mm) suspicious for choledocholithiasis. 2. No sonographic signs of acute cholecystitis. Electronically signed by: Helayne Hurst MD 08/17/2024 09:08 AM EST RP Workstation: [id number]  Medical Consultants:   None.   Subjective:    Orlean HERO Swett no further abdominal pain.  Objective:    Vitals:   08/17/24 1926 08/17/24 2038 08/17/24 2325 08/18/24 0447  BP: 124/66  113/61 133/65  Pulse: 73  79 79  Resp: 14  16 14   Temp: 97.7 F (36.5 C)  98.1 F (36.7 C) 98.1 F (36.7 C)  TempSrc: Oral  Oral Oral  SpO2: 100% 94% 99% 96%  Weight:      Height:       SpO2: 96 %   Intake/Output Summary (Last 24 hours) at 08/18/2024 0720 Last data filed at 08/18/2024 0600 Gross per 24 hour  Intake 240 ml  Output --  Net 240 ml   Filed Weights   08/17/24 0728  Weight: 92.5 kg    Exam: General exam: In no acute distress. Respiratory system: Good air movement and clear to auscultation. Cardiovascular system: S1 & S2 heard, RRR. No JVD.  Gastrointestinal system: Abdomen is nondistended, soft and nontender.  Extremities: No pedal edema. Skin: No rashes, lesions or ulcers Psychiatry: Judgement and insight appear normal. Mood & affect appropriate.    Data Reviewed:    Labs: Basic Metabolic Panel: Recent Labs  Lab 08/17/24 0830 08/18/24 0524  NA 139 141  K 4.6 4.0  CL 104 106  CO2 22 26  GLUCOSE 123* 83  BUN 11 9  CREATININE 0.87 0.94  CALCIUM  9.5 9.1   GFR Estimated Creatinine Clearance: 59 mL/min (by C-G formula based on SCr of 0.94 mg/dL). Liver Function Tests: Recent Labs  Lab 08/17/24 0830 08/18/24 0524  AST 27 16  ALT 13 12  ALKPHOS 83 73  BILITOT 0.4 0.5  PROT 7.4 6.6  ALBUMIN 4.3 3.9   Recent Labs  Lab 08/17/24 0830  LIPASE 39   No results for input(s): AMMONIA in the last 168 hours. Coagulation  profile No results for input(s): INR, PROTIME in the last 168 hours. COVID-19 Labs  No results for input(s): DDIMER, FERRITIN, LDH, CRP in the last 72 hours.  No results found for: SARSCOV2NAA  CBC: Recent Labs  Lab 08/17/24 0830 08/18/24 0524  WBC 10.1 7.1  HGB 10.6* 9.5*  HCT 33.4* 29.9*  MCV 88.6 87.7  PLT 276 253   Cardiac Enzymes: No results for input(s): CKTOTAL, CKMB, CKMBINDEX, TROPONINI in the last 168 hours. BNP (last 3 results) No results for input(s): PROBNP in the last 8760 hours. CBG: Recent Labs  Lab 08/17/24 1300 08/17/24 1631 08/17/24 2137 08/18/24 0715  GLUCAP 93 79 83 89   D-Dimer: No results for input(s): DDIMER in the last 72 hours. Hgb A1c: Recent Labs    08/17/24 1315  HGBA1C 5.0   Lipid Profile: No results for input(s): CHOL, HDL, LDLCALC, TRIG, CHOLHDL, LDLDIRECT in the last 72 hours. Thyroid function studies: No results for input(s): TSH, T4TOTAL, T3FREE, THYROIDAB in the last 72 hours.  Invalid input(s): FREET3 Anemia work up: No results for input(s): VITAMINB12, FOLATE, FERRITIN, TIBC, IRON, RETICCTPCT in the last 72 hours. Sepsis Labs: Recent Labs  Lab 08/17/24 0830 08/18/24 0524  WBC 10.1 7.1   Microbiology Recent Results (from the past 240 hours)  Surgical PCR screen     Status: None   Collection Time: 08/18/24  4:50 AM   Specimen: Nasal Mucosa; Nasal Swab  Result Value Ref Range Status   MRSA, PCR NEGATIVE NEGATIVE Final   Staphylococcus aureus NEGATIVE NEGATIVE Final    Comment: (NOTE) The Xpert SA Assay (  FDA approved for NASAL specimens in patients 67 years of age and older), is one component of a comprehensive surveillance program. It is not intended to diagnose infection nor to guide or monitor treatment. Performed at [facility], [address] [place], [place] [address]      Medications:    acetaminophen   1,000 mg Oral Q8H    aspirin  EC  81 mg Oral Daily   atorvastatin   80 mg Oral QPM   budesonide -glycopyrrolate -formoterol   2 puff Inhalation BID   enoxaparin  (LOVENOX ) injection  50 mg Subcutaneous Q24H   ezetimibe   10 mg Oral Daily   indocyanine green   1.25 mg Intravenous On Call to OR   insulin  aspart  0-9 Units Subcutaneous TID WC   metoprolol  succinate  25 mg Oral QHS   montelukast   10 mg Oral QHS    morphine  injection  4 mg Intravenous Once   mupirocin  ointment  1 Application Nasal BID   Continuous Infusions:   ceFAZolin  (ANCEF ) IV        LOS: 1 day   Erle Odell Castor  [facility]  08/18/2024, 7:20 AM

## 2024-08-18 NOTE — Plan of Care (Signed)
   Problem: Coping: Goal: Ability to adjust to condition or change in health will improve Outcome: Progressing

## 2024-08-18 NOTE — Anesthesia Preprocedure Evaluation (Signed)
 Anesthesia Evaluation  Patient identified by MRN, date of birth, ID band Patient awake    Reviewed: Allergy & Precautions, NPO status , Patient's Chart, lab work & pertinent test results  Airway Mallampati: II  TM Distance: >3 FB Neck ROM: Full    Dental no notable dental hx.    Pulmonary asthma , COPD   Pulmonary exam normal        Cardiovascular hypertension, Pt. on medications and Pt. on home beta blockers + CAD, + Peripheral Vascular Disease and +CHF   Rhythm:Regular Rate:Normal  1.  Mild, nonobstructive coronary artery disease. 2.  Fick cardiac output of 6.8 L/min and Fick cardiac index of 3.5 L/min/m with the following hemodynamics:  Right atrial pressure mean of 2 mmHg  Right ventricular pressure 22/3 with an end-diastolic pressure of 4 mmHg  Wedge pressure mean of 5 mmHg with V waves to 9 mmHg  PA pressure 26/7 with a mean of 15 mmHg  PVR of 1.5 Woods units  PA pulsatility index of greater than 5 3.  LVEDP of 4 to 5 mmHg    Neuro/Psych negative neurological ROS  negative psych ROS   GI/Hepatic Neg liver ROS,GERD  ,,  Endo/Other  negative endocrine ROS    Renal/GU negative Renal ROS  negative genitourinary   Musculoskeletal  (+) Arthritis ,    Abdominal Normal abdominal exam  (+)   Peds  Hematology Lab Results      Component                Value               Date                      WBC                      7.1                 08/18/2024                HGB                      9.5 (L)             08/18/2024                HCT                      29.9 (L)            08/18/2024                MCV                      87.7                08/18/2024                PLT                      253                 08/18/2024             Lab Results      Component                Value  Date                      NA                       141                 08/18/2024                K                         4.0                 08/18/2024                CO2                      26                  08/18/2024                GLUCOSE                  83                  08/18/2024                BUN                      9                   08/18/2024                CREATININE               0.94                08/18/2024                CALCIUM                   9.1                 08/18/2024                GFR                      106.21              05/26/2015                EGFR                     59 (L)              06/13/2024                GFRNONAA                 >60                 08/18/2024              Anesthesia Other Findings   Reproductive/Obstetrics                              Anesthesia Physical Anesthesia Plan  ASA: 3  Anesthesia Plan: General   Post-op  Pain Management: Tylenol  PO (pre-op)*   Induction: Intravenous  PONV Risk Score and Plan: 3 and Ondansetron , Dexamethasone  and Treatment may vary due to age or medical condition  Airway Management Planned: Mask and Oral ETT  Additional Equipment: None  Intra-op Plan:   Post-operative Plan: Extubation in OR  Informed Consent: I have reviewed the patients History and Physical, chart, labs and discussed the procedure including the risks, benefits and alternatives for the proposed anesthesia with the patient or authorized representative who has indicated his/her understanding and acceptance.     Dental advisory given  Plan Discussed with: CRNA  Anesthesia Plan Comments:         Anesthesia Quick Evaluation

## 2024-08-18 NOTE — Transfer of Care (Signed)
 Immediate Anesthesia Transfer of Care Note  Patient: Diane Shaw  Procedure(s) Performed: LAPAROSCOPIC CHOLECYSTECTOMY (Abdomen)  Patient Location: PACU  Anesthesia Type:General  Level of Consciousness: awake, alert , oriented, and patient cooperative  Airway & Oxygen Therapy: Patient Spontanous Breathing and Patient connected to nasal cannula oxygen  Post-op Assessment: Report given to RN and Post -op Vital signs reviewed and stable  Post vital signs: Reviewed and stable  Last Vitals:  Vitals Value Taken Time  BP 120/56 08/18/24 10:45  Temp 36.3 C 08/18/24 10:45  Pulse 75 08/18/24 10:50  Resp 14 08/18/24 10:50  SpO2 100 % 08/18/24 10:50  Vitals shown include unfiled device data.  Last Pain:  Vitals:   08/18/24 1045  TempSrc:   PainSc: 0-No pain         Complications: No notable events documented.

## 2024-08-18 NOTE — Interval H&P Note (Signed)
 History and Physical Interval Note:  08/18/2024 8:14 AM  Diane Shaw  has presented today for surgery, with the diagnosis of ACUTE CHOLECYSTITIS.  The various methods of treatment have been discussed with the patient and family. After consideration of risks, benefits and other options for treatment, the patient has consented to  Procedure(s): LAPAROSCOPIC CHOLECYSTECTOMY (N/A) as a surgical intervention.  The patient's history has been reviewed, patient examined, no change in status, stable for surgery.  I have reviewed the patient's chart and labs.  Questions were answered to the patient's satisfaction.     [name]

## 2024-08-18 NOTE — Anesthesia Procedure Notes (Signed)
 Procedure Name: Intubation Date/Time: 08/18/2024 9:46 AM  Performed by: Pheobe Adine CROME, CRNAPre-anesthesia Checklist: Patient identified, Emergency Drugs available, Suction available, Patient being monitored and Timeout performed Patient Re-evaluated:Patient Re-evaluated prior to induction Oxygen Delivery Method: Circle system utilized Preoxygenation: Pre-oxygenation with 100% oxygen Induction Type: IV induction Ventilation: Mask ventilation without difficulty and Two handed mask ventilation required Laryngoscope Size: Miller and 2 Grade View: Grade II Tube type: Oral Tube size: 7.0 mm Number of attempts: 1 Airway Equipment and Method: Stylet Placement Confirmation: ETT inserted through vocal cords under direct vision, positive ETCO2, CO2 detector and breath sounds checked- equal and bilateral Secured at: 21 cm Tube secured with: Tape Dental Injury: Teeth and Oropharynx as per pre-operative assessment

## 2024-08-18 NOTE — Plan of Care (Signed)

## 2024-08-18 NOTE — Anesthesia Postprocedure Evaluation (Signed)
 Anesthesia Post Note  Patient: Diane Shaw  Procedure(s) Performed: LAPAROSCOPIC CHOLECYSTECTOMY (Abdomen)     Patient location during evaluation: PACU Anesthesia Type: General Level of consciousness: awake and alert Pain management: pain level controlled Vital Signs Assessment: post-procedure vital signs reviewed and stable Respiratory status: spontaneous breathing, nonlabored ventilation, respiratory function stable and patient connected to nasal cannula oxygen Cardiovascular status: blood pressure returned to baseline and stable Postop Assessment: no apparent nausea or vomiting Anesthetic complications: no   No notable events documented.  Last Vitals:  Vitals:   08/18/24 1130 08/18/24 1145  BP: (!) 134/59 (!) 135/55  Pulse: 73 70  Resp: 16 17  Temp:    SpO2: 99% 99%    Last Pain:  Vitals:   08/18/24 1145  TempSrc:   PainSc: 0-No pain                 Cordella P Belladonna Lubinski

## 2024-08-18 NOTE — Op Note (Signed)
 Diane Shaw [id number] 08/18/2024  Laparoscopic Cholecystectomy with near infrared fluorescent cholangiography procedure Note  Indications: This patient presents with symptomatic gallbladder disease and will undergo laparoscopic cholecystectomy.  Pre-operative Diagnosis: Acute calculous cholecystitis  Post-operative Diagnosis: Same  Surgeon: [name] MD FACS  Assistants: Donnice Bury MD FACS (an assistant was requested and required to help with tissue retraction and manipulation and identification of structures due to the infection/inflammation of the tissue)  Anesthesia: General endotracheal anesthesia  Procedure Details  The patient was seen again in the Holding Room. The risks, benefits, complications, treatment options, and expected outcomes were discussed with the patient. The possibilities of reaction to medication, pulmonary aspiration, perforation of viscus, bleeding, recurrent infection, finding a normal gallbladder, the need for additional procedures, failure to diagnose a condition, the possible need to convert to an open procedure, and creating a complication requiring transfusion or operation were discussed with the patient. The likelihood of improving the patient's symptoms with return to their baseline status is good.  The patient and/or family concurred with the proposed plan, giving informed consent. The site of surgery properly noted. The patient was taken to Operating Room, identified as Diane Shaw and the procedure verified as Laparoscopic Cholecystectomy with ICG dye.  A Time Out was held and the above information confirmed. Antibiotic prophylaxis was administered.    ICG dye was administered preoperatively.    General endotracheal anesthesia was then administered and tolerated well. After the induction, the abdomen was prepped with Chloraprep and draped in the sterile fashion. The patient was positioned in the supine position.  The patient had  a palpable soft tissue lump in her left lower abdomen just below the umbilicus at the 4 o'clock position about the size of a golf ball.  Local anesthetic agent was injected into the skin near the umbilicus and an incision made. We dissected down to the abdominal fascia with blunt dissection.  The fascia was incised vertically and we entered the peritoneal cavity bluntly.  A pursestring suture of 0-Vicryl was placed around the fascial opening.  The Hasson cannula was inserted and secured with the stay suture.  Pneumoperitoneum was then created with CO2 and tolerated well without any adverse changes in the patient's vital signs. An 5-mm port was placed in the subxiphoid position.  Two 5-mm ports were placed in the right upper quadrant. All skin incisions were infiltrated with a local anesthetic agent before making the incision and placing the trocars.  There was no evidence of a fascial defect in the left lower quadrant near where that palpable subcutaneous soft tissue mass was palpated.  We positioned the patient in reverse Trendelenburg, tilted slightly to the patient's left.  The gallbladder was identified.  It was distended and thick-walled and edematous.  It had to be aspirated in order to facilitate retraction., the fundus grasped and retracted cephalad. Adhesions were lysed bluntly and with the electrocautery where indicated, taking care not to injure any adjacent organs or viscus. The infundibulum was grasped and retracted laterally, exposing the peritoneum overlying the triangle of Calot. This was then divided and exposed in a blunt fashion. A critical view of the cystic duct and cystic artery was obtained.  The cystic duct was clearly identified and bluntly dissected circumferentially.  Utilizing the Stryker camera system near infrared fluorescent activity was visualized in the liver, cystic duct, common hepatic duct and common bile duct.  This served as a secondary confirmation of our anatomy.  The  cystic duct was then  ligated with 3 clips on the patient's side and 1 on the specimen side and divided. The cystic artery (both an anterior and a little bit larger posterior branch) which had been identified & dissected free wereligated with clips and divided as well.   The gallbladder was dissected from the liver bed in retrograde fashion with the electrocautery. The gallbladder was removed and placed in an Ecco sac.  The gallbladder and Ecco sac were then removed through the umbilical port site. The liver bed was irrigated and inspected. Hemostasis was achieved with the electrocautery. Copious irrigation was utilized and was repeatedly aspirated until clear.  I did place a piece of surgical snow in the gallbladder fossa.  The pursestring suture was used to close the umbilical fascia.  An additional interrupted 0 Vicryl was placed using a PMI suture passer with laparoscopic guidance  We again inspected the right upper quadrant for hemostasis.  The umbilical closure was inspected and there was no air leak and nothing trapped within the closure. Pneumoperitoneum was released as we removed the trocars.  4-0 Monocryl was used to close the skin.   dermabond & steristrips were applied. The patient was then extubated and brought to the recovery room in stable condition. Instrument, sponge, and needle counts were correct at closure and at the conclusion of the case.   Findings: Cholecystitis with Cholelithiasis critical view Infrared fluorescent cholangiography visualized within the common hepatic duct, common bile duct, cystic duct and small bowel  Estimated Blood Loss: Minimal         Drains: none         Specimens: Gallbladder           Complications: None; patient tolerated the procedure well.         Disposition: PACU - hemodynamically stable.         Condition: stable  Camellia HERO. Tanda, MD, FACS General, Bariatric, & Minimally Invasive Surgery [facility],  [facility]

## 2024-08-19 ENCOUNTER — Encounter (HOSPITAL_COMMUNITY): Payer: Self-pay | Admitting: General Surgery

## 2024-08-19 DIAGNOSIS — K8050 Calculus of bile duct without cholangitis or cholecystitis without obstruction: Secondary | ICD-10-CM | POA: Diagnosis not present

## 2024-08-19 DIAGNOSIS — R1011 Right upper quadrant pain: Secondary | ICD-10-CM | POA: Diagnosis not present

## 2024-08-19 DIAGNOSIS — K8020 Calculus of gallbladder without cholecystitis without obstruction: Secondary | ICD-10-CM | POA: Diagnosis not present

## 2024-08-19 DIAGNOSIS — K838 Other specified diseases of biliary tract: Secondary | ICD-10-CM | POA: Diagnosis not present

## 2024-08-19 LAB — CBC
HCT: 32 % — ABNORMAL LOW (ref 36.0–46.0)
Hemoglobin: 10.3 g/dL — ABNORMAL LOW (ref 12.0–15.0)
MCH: 28.4 pg (ref 26.0–34.0)
MCHC: 32.2 g/dL (ref 30.0–36.0)
MCV: 88.2 fL (ref 80.0–100.0)
Platelets: 261 K/uL (ref 150–400)
RBC: 3.63 MIL/uL — ABNORMAL LOW (ref 3.87–5.11)
RDW: 13.5 % (ref 11.5–15.5)
WBC: 13.9 K/uL — ABNORMAL HIGH (ref 4.0–10.5)
nRBC: 0 % (ref 0.0–0.2)

## 2024-08-19 LAB — BASIC METABOLIC PANEL WITH GFR
Anion gap: 12 (ref 5–15)
BUN: 11 mg/dL (ref 8–23)
CO2: 24 mmol/L (ref 22–32)
Calcium: 9.6 mg/dL (ref 8.9–10.3)
Chloride: 102 mmol/L (ref 98–111)
Creatinine, Ser: 0.96 mg/dL (ref 0.44–1.00)
GFR, Estimated: >60 mL/min (ref >60)
Glucose, Bld: 130 mg/dL — ABNORMAL HIGH (ref 70–99)
Potassium: 4.1 mmol/L (ref 3.5–5.1)
Sodium: 138 mmol/L (ref 135–145)

## 2024-08-19 LAB — GLUCOSE, CAPILLARY: Glucose-Capillary: 124 mg/dL — ABNORMAL HIGH (ref 70–99)

## 2024-08-19 MED ORDER — OXYCODONE HCL 5 MG PO TABS: 5.0000 mg | ORAL_TABLET | ORAL | 0 refills | Status: AC | PRN

## 2024-08-19 NOTE — Discharge Instructions (Addendum)

## 2024-08-19 NOTE — Progress Notes (Signed)
   08/19/24 0848  TOC Brief Assessment  Insurance and Status Reviewed  Patient has primary care physician Yes  Home environment has been reviewed home alone  Prior level of function: independent  Prior/Current Home Services No current home services  Social Drivers of Health Review SDOH reviewed no interventions necessary  Readmission risk has been reviewed Yes  Transition of care needs no transition of care needs at this time

## 2024-08-19 NOTE — Progress Notes (Signed)
 Discharge instructions given to patient, no questions at this time. Discharged to home with family

## 2024-08-19 NOTE — Discharge Summary (Addendum)
 Physician Discharge Summary  Diane Shaw FMW:[id number] DOB: 1951/01/07 DOA: 08/17/2024  PCP: Campbell Reynolds, NP  Admit date: 08/17/2024 Discharge date: 08/19/2024  Admitted From: Home Disposition:  Home  Recommendations for Outpatient Follow-up:  Follow up with PCP in 1-2 weeks Please obtain BMP/CBC in one week   Home Health:No Equipment/Devices:None  Discharge Condition:Stable CODE STATUS:Full Diet recommendation: Heart Healthy   Brief/Interim Summary: 73 y.o. female past medical history significant for nonobstructive CAD, essential hypertension morbid obesity intermittent asthma comes in complaining of abdominal pain, right upper quadrant ultrasound showed gallstone dilated CBD AST and ALT were elevated General Surgery was consulted.    Discharge Diagnoses:  Principal Problem:   Cholelithiasis  Acute calculous cholecystitis: MRCP showed 2 mm gallstone near the wall gallbladder neck. General surgery was consulted to perform lap chole on 08/18/2024. After this she was able to tolerate her diet was passing gas and pain was controlled.  Essential hypertension: No change made to her medication.  Insulin -dependent diabetes mellitus type 2: No changes made made to her medication should continue her insulin  regimen as an outpatient.  Nonobstructive CAD: Continue aspirin  and statins.  Obesity: Continue Ozempic.  Chronic diastolic dysfunction: Continue current home meds.    Discharge Instructions  Discharge Instructions     Diet - low sodium heart healthy   Complete by: As directed    Increase activity slowly   Complete by: As directed       Allergies as of 08/19/2024   No Known Allergies      Medication List     TAKE these medications    albuterol  108 (90 Base) MCG/ACT inhaler Commonly known as: VENTOLIN  HFA Inhale 2 puffs into the lungs every 4 (four) hours as needed for wheezing or shortness of breath.   albuterol  0.63 MG/3ML nebulizer  solution Commonly known as: ACCUNEB  Take 1 ampule by nebulization daily as needed (asthma).   amLODipine  10 MG tablet Commonly known as: NORVASC  Take 10 mg by mouth daily.   aspirin  EC 81 MG tablet Take 81 mg by mouth daily. Swallow whole.   atorvastatin  80 MG tablet Commonly known as: LIPITOR TAKE 1 TABLET(80 MG) BY MOUTH DAILY   empagliflozin  10 MG Tabs tablet Commonly known as: JARDIANCE  Take 1 tablet (10 mg total) by mouth daily before breakfast.   ezetimibe  10 MG tablet Commonly known as: ZETIA  Take 1 tablet (10 mg total) by mouth daily.   fluticasone  50 MCG/ACT nasal spray Commonly known as: FLONASE  Place 2 sprays into both nostrils daily as needed for allergies.   hydrochlorothiazide  12.5 MG tablet Commonly known as: HYDRODIURIL  Take 12.5 mg by mouth daily.   losartan  25 MG tablet Commonly known as: COZAAR  Take 1 tablet (25 mg total) by mouth daily.   metFORMIN 500 MG 24 hr tablet Commonly known as: GLUCOPHAGE-XR Take 500 mg by mouth daily.   metoprolol  succinate 25 MG 24 hr tablet Commonly known as: TOPROL -XL Take 1 tablet (25 mg total) by mouth at bedtime.   montelukast  10 MG tablet Commonly known as: SINGULAIR  Take 1 tablet (10 mg total) by mouth at bedtime.   multivitamins ther. w/minerals Tabs tablet Take 1 tablet by mouth daily.   oxyCODONE  5 MG immediate release tablet Commonly known as: Oxy IR/ROXICODONE  Take 1 tablet (5 mg total) by mouth every 4 (four) hours as needed for up to 3 days for moderate pain (pain score 4-6).   Ozempic (2 MG/DOSE) 8 MG/3ML Sopn Generic drug: Semaglutide (2 MG/DOSE) Inject 2 mg into  the skin once a week.   Trelegy Ellipta  200-62.5-25 MCG/ACT Aepb Generic drug: Fluticasone -Umeclidin-Vilant Inhale 1 puff into the lungs daily.   triamcinolone ointment 0.1 % Commonly known as: KENALOG 1 Application 2 (two) times daily as needed (rash).   vitamin B-12 500 MCG tablet Commonly known as: CYANOCOBALAMIN  Take 500  mcg by mouth daily.        Follow-up Information     Maczis, Puja Gosai, PA-C. Call.   Specialty: General Surgery Why: Call to confirm you appointment, Arrive 30 mins prior to scheduled appointment time, Please bring your ID and insurance cards Contact information: [address] [facility] [place] [address] [phone]                No Known Allergies  Consultations: General Surgery   Procedures/Studies: MR 3D Recon At Scanner Result Date: 08/17/2024 XAM: MRCP WITH AND WITHOUT IV CONTRAST 08/17/2024 02: 49:11 PM TECHNIQUE: Multisequence, multiplanar magnetic resonance images of the abdomen with and without intravenous contrast. MRCP sequences were performed. 10 mL gadobutrol  (GADAVIST ) 1 MMOL/ML injection was administered. COMPARISON: Ultrasound 08/17/2024. CLINICAL HISTORY: Cholelithiasis; r/o CBD stone. FINDINGS: LIVER: Unremarkable. GALLBLADDER AND BILIARY SYSTEM: There are multiple small 1 mm stones within the fundus of the gallbladder which are change numerous to count There is a larger 18 mm faceted stone towards the neck of the gallbladder. The gallbladder is nondistended measuring 3 cm in diameter. No gallbladder wall thickening and pericholecystic fluid. The common bile duct is mildly dilated at 5 to 10 mm. The common bile duct tapers to the ampulla without obstructing calculus. No intrahepatic ductal dilatation. SPLEEN: Unremarkable. PANCREAS/PANCREATIC DUCT: There is mild pancreatic ductal dilatation throughout the head, body, and tail of the pancreas. The pancreatic duct measures 5 mm on image 15/3. No acute pancreatic inflammation. ADRENAL GLANDS: Unremarkable. KIDNEYS: There are bilateral fluid intensity cysts within the left and right kidneys. These show no postcontrast enhancement, consistent with benign renal cysts. No hydronephrosis. LYMPH NODES: No enlarged abdominal lymph nodes. VASCULATURE: Unremarkable. PERITONEUM: No  ascites. ABDOMINAL WALL: No hernia. No mass. BOWEL: Grossly unremarkable. No bowel obstruction. BONES: No acute abnormality or worrisome osseous lesion. SOFT TISSUES: Unremarkable. MISCELLANEOUS: Unremarkable. IMPRESSION: 1. Cholelithiasis with multiple small gallstones and an 18 mm gallstone near the gallbladder neck, without imaging signs of acute cholecystitis. 2. Mild dilation of the common bile duct without an obstructing calculus identified. 3. Mild diffuse pancreatic ductal dilatation without acute pancreatitis. . Electronically signed by: Norleen Boxer MD 08/17/2024 03:06 PM EST RP Workstation: [id number]   MR ABDOMEN MRCP W WO CONTAST Result Date: 08/17/2024 EXAM: MRCP WITH AND WITHOUT IV CONTRAST 08/17/2024 02:49:11 PM TECHNIQUE: Multisequence, multiplanar magnetic resonance images of the abdomen with and without intravenous contrast. MRCP sequences were performed. 10 mL gadobutrol  (GADAVIST ) 1 MMOL/ML injection was administered. COMPARISON: Ultrasound 08/17/2024. CLINICAL HISTORY: Cholelithiasis; r/o CBD stone. FINDINGS: LIVER: Unremarkable. GALLBLADDER AND BILIARY SYSTEM: There are multiple small 1 mm stones within the fundus of the gallbladder which are change numerous to count There is a larger 18 mm faceted stone towards the neck of the gallbladder. The gallbladder is nondistended measuring 3 cm in diameter. No gallbladder wall thickening and pericholecystic fluid. The common bile duct is mildly dilated at 5 to 10 mm. The common bile duct tapers to the ampulla without obstructing calculus. No intrahepatic ductal dilatation. SPLEEN: Unremarkable. PANCREAS/PANCREATIC DUCT: There is mild pancreatic ductal dilatation throughout the head, body, and tail of the pancreas. The pancreatic duct measures  5 mm on image 15/3. No acute pancreatic inflammation. ADRENAL GLANDS: Unremarkable. KIDNEYS: There are bilateral fluid intensity cysts within the left and right kidneys. These show no postcontrast  enhancement, consistent with benign renal cysts. No hydronephrosis. LYMPH NODES: No enlarged abdominal lymph nodes. VASCULATURE: Unremarkable. PERITONEUM: No ascites. ABDOMINAL WALL: No hernia. No mass. BOWEL: Grossly unremarkable. No bowel obstruction. BONES: No acute abnormality or worrisome osseous lesion. SOFT TISSUES: Unremarkable. MISCELLANEOUS: Unremarkable. IMPRESSION: 1. Cholelithiasis with multiple small gallstones and an 18 mm gallstone near the gallbladder neck, without imaging signs of acute cholecystitis. 2. Mild dilation of the common bile duct without an obstructing calculus identified. 3. Mild diffuse pancreatic ductal dilatation without acute pancreatitis. Electronically signed by: Norleen Boxer MD 08/17/2024 03:01 PM EST RP Workstation: [id number]   DG Chest 2 View Result Date: 08/17/2024 CLINICAL DATA:  chest pain EXAM: DG CHEST 2V COMPARISON:  August 17, 2018 FINDINGS: The cardiomediastinal silhouette is mildly enlarged in contour.Atherosclerotic calcifications. No pleural effusion. No pneumothorax. No acute pleuroparenchymal abnormality. Visualized abdomen is unremarkable. Multilevel degenerative changes of the thoracic spine. Mild compression fracture deformities at the thoracolumbar junction, favored slightly increased since prior. IMPRESSION: 1. No acute cardiopulmonary abnormality. 2. Mild compression fracture deformities at the thoracolumbar junction, favored slightly increased since prior. Recommend correlation with point tenderness. Electronically Signed   By: Corean Salter M.D.   On: 08/17/2024 09:16   US  Abdomen Limited RUQ (LIVER/GB) Result Date: 08/17/2024 EXAM: Right Upper Quadrant Abdominal Ultrasound 08/17/2024 08:51:53 AM TECHNIQUE: Real-time ultrasonography of the right upper quadrant of the abdomen was performed. COMPARISON: Chest CT 11/10/2022. CLINICAL HISTORY: 73 year old female with abdominal pain. FINDINGS: LIVER: The liver demonstrates normal echogenicity.  Mild intrahepatic biliary ductal dilatation suspected. No evidence of mass. BILIARY SYSTEM: Confluent and shadowing gallstones within the gallbladder (series 1 image 4). Gallbladder wall thickness measures 0.2 cm, normal. No sonographic Murphy sign. No pericholecystic fluid. Abnormally dilated common bile duct, 8 to 9 mm. No visible filling defect within the bile duct (image 21 of series 1). OTHER: No right upper quadrant ascites. IMPRESSION: 1. Extensive Cholelithiasis, with Dilated common bile duct (9 mm) suspicious for choledocholithiasis. 2. No sonographic signs of acute cholecystitis. Electronically signed by: Helayne Hurst MD 08/17/2024 09:08 AM EST RP Workstation: [id number]   (Echo, Carotid, EGD, Colonoscopy, ERCP)    Subjective: No complaints  Discharge Exam: Vitals:   08/19/24 0518 08/19/24 0740  BP: (!) 144/70   Pulse: 87   Resp: 15   Temp: 98.1 F (36.7 C)   SpO2: 100% 97%   Vitals:   08/18/24 2013 08/18/24 2315 08/19/24 0518 08/19/24 0740  BP: 136/69  (!) 144/70   Pulse: 86  87   Resp: 14  15   Temp: 97.9 F (36.6 C)  98.1 F (36.7 C)   TempSrc: Oral  Oral   SpO2: 97% 96% 100% 97%  Weight:      Height:        General: Pt is alert, awake, not in acute distress Cardiovascular: RRR, S1/S2 +, no rubs, no gallops Respiratory: CTA bilaterally, no wheezing, no rhonchi Abdominal: Soft, NT, ND, bowel sounds + Extremities: no edema, no cyanosis    The results of significant diagnostics from this hospitalization (including imaging, microbiology, ancillary and laboratory) are listed below for reference.     Microbiology: Recent Results (from the past 240 hours)  Surgical PCR screen     Status: None   Collection Time: 08/18/24  4:50 AM   Specimen: Nasal Mucosa; Nasal Swab  Result Value Ref Range Status   MRSA, PCR NEGATIVE NEGATIVE Final   Staphylococcus aureus NEGATIVE NEGATIVE Final    Comment: (NOTE) The Xpert SA Assay (FDA approved for NASAL specimens in patients  71 years of age and older), is one component of a comprehensive surveillance program. It is not intended to diagnose infection nor to guide or monitor treatment. Performed at [facility], [address] [place], [place] [address]      Labs: BNP (last 3 results) No results for input(s): BNP in the last 8760 hours. Basic Metabolic Panel: Recent Labs  Lab 08/17/24 0830 08/18/24 0524 08/19/24 0434  NA 139 141 138  K 4.6 4.0 4.1  CL 104 106 102  CO2 22 26 24   GLUCOSE 123* 83 130*  BUN 11 9 11   CREATININE 0.87 0.94 0.96  CALCIUM  9.5 9.1 9.6   Liver Function Tests: Recent Labs  Lab 08/17/24 0830 08/18/24 0524  AST 27 16  ALT 13 12  ALKPHOS 83 73  BILITOT 0.4 0.5  PROT 7.4 6.6  ALBUMIN 4.3 3.9   Recent Labs  Lab 08/17/24 0830  LIPASE 39   No results for input(s): AMMONIA in the last 168 hours. CBC: Recent Labs  Lab 08/17/24 0830 08/18/24 0524 08/19/24 0434  WBC 10.1 7.1 13.9*  HGB 10.6* 9.5* 10.3*  HCT 33.4* 29.9* 32.0*  MCV 88.6 87.7 88.2  PLT 276 253 261   Cardiac Enzymes: No results for input(s): CKTOTAL, CKMB, CKMBINDEX, TROPONINI in the last 168 hours. BNP: Invalid input(s): POCBNP CBG: Recent Labs  Lab 08/17/24 2137 08/18/24 0715 08/18/24 1703 08/18/24 2103 08/19/24 0746  GLUCAP 83 89 163* 163* 124*   D-Dimer No results for input(s): DDIMER in the last 72 hours. Hgb A1c Recent Labs    08/17/24 1315  HGBA1C 5.0   Lipid Profile No results for input(s): CHOL, HDL, LDLCALC, TRIG, CHOLHDL, LDLDIRECT in the last 72 hours. Thyroid function studies No results for input(s): TSH, T4TOTAL, T3FREE, THYROIDAB in the last 72 hours.  Invalid input(s): FREET3 Anemia work up No results for input(s): VITAMINB12, FOLATE, FERRITIN, TIBC, IRON, RETICCTPCT in the last 72 hours. Urinalysis    Component Value Date/Time   BILIRUBINUR neg 05/26/2015 1530   PROTEINUR neg 05/26/2015  1530   UROBILINOGEN 0.2 05/26/2015 1530   NITRITE neg 05/26/2015 1530   LEUKOCYTESUR large (3+) (A) 05/26/2015 1530   Sepsis Labs Recent Labs  Lab 08/17/24 0830 08/18/24 0524 08/19/24 0434  WBC 10.1 7.1 13.9*   Microbiology Recent Results (from the past 240 hours)  Surgical PCR screen     Status: None   Collection Time: 08/18/24  4:50 AM   Specimen: Nasal Mucosa; Nasal Swab  Result Value Ref Range Status   MRSA, PCR NEGATIVE NEGATIVE Final   Staphylococcus aureus NEGATIVE NEGATIVE Final    Comment: (NOTE) The Xpert SA Assay (FDA approved for NASAL specimens in patients 52 years of age and older), is one component of a comprehensive surveillance program. It is not intended to diagnose infection nor to guide or monitor treatment. Performed at [facility] [address], [place], [place] [address]      Time coordinating discharge: Over 35 minutes  SIGNED:   Erle Odell Castor, MD  [facility] 08/19/2024, 7:59 AM Pager   If 7PM-7AM, please contact night-coverage [email or web address] Password [email or web address]

## 2024-08-19 NOTE — Progress Notes (Signed)
 Progress Note  1 Day Post-Op  Subjective: Patient reports that pain is manageable. She is tolerating regular diet without nausea and vomiting. Has not had BM but is having flatus. Denies new concerns.  ROS  All negative with the exception of above.  Objective: Vital signs in last 24 hours: Temp:  [96.8 F (36 C)-98.1 F (36.7 C)] 98.1 F (36.7 C) (11/24 0518) Pulse Rate:  [70-87] 87 (11/24 0518) Resp:  [14-20] 15 (11/24 0518) BP: (117-144)/(47-70) 144/70 (11/24 0518) SpO2:  [94 %-100 %] 97 % (11/24 0740) Last BM Date : 08/17/24  Intake/Output from previous day: 11/23 0701 - 11/24 0700 In: 1480 [P.O.:1380; IV Piggyback:100] Out: -  Intake/Output this shift: No intake/output data recorded.  PE: General: Pleasant female who is laying in bed in NAD. HEENT: Head is normocephalic, atraumatic. Heart: HR normal during encounter. Lungs:  Respiratory effort nonlabored. Abd: Soft with mild distention. NT. Incisions with steri strips C/D/I. No rebound tenderness or guarding.  MS: Able to move all 4 extremities.  Skin: Warm and dry/ Psych: A&Ox3 with an appropriate affect.    Lab Results:  Recent Labs    08/18/24 0524 08/19/24 0434  WBC 7.1 13.9*  HGB 9.5* 10.3*  HCT 29.9* 32.0*  PLT 253 261   BMET Recent Labs    08/18/24 0524 08/19/24 0434  NA 141 138  K 4.0 4.1  CL 106 102  CO2 26 24  GLUCOSE 83 130*  BUN 9 11  CREATININE 0.94 0.96  CALCIUM  9.1 9.6   PT/INR No results for input(s): LABPROT, INR in the last 72 hours. CMP     Component Value Date/Time   NA 138 08/19/2024 0434   NA 144 06/13/2024 1118   K 4.1 08/19/2024 0434   CL 102 08/19/2024 0434   CO2 24 08/19/2024 0434   GLUCOSE 130 (H) 08/19/2024 0434   BUN 11 08/19/2024 0434   BUN 17 06/13/2024 1118   CREATININE 0.96 08/19/2024 0434   CALCIUM  9.6 08/19/2024 0434   PROT 6.6 08/18/2024 0524   PROT 6.8 06/13/2024 1118   ALBUMIN 3.9 08/18/2024 0524   ALBUMIN 4.4 06/13/2024 1118   AST 16  08/18/2024 0524   ALT 12 08/18/2024 0524   ALKPHOS 73 08/18/2024 0524   BILITOT 0.5 08/18/2024 0524   BILITOT 0.3 06/13/2024 1118   GFRNONAA >60 08/19/2024 0434   GFRAA >90 01/30/2012 0455   Lipase     Component Value Date/Time   LIPASE 39 08/17/2024 0830       Studies/Results: MR 3D Recon At Scanner Result Date: 08/17/2024 XAM: MRCP WITH AND WITHOUT IV CONTRAST 08/17/2024 02: 49:11 PM TECHNIQUE: Multisequence, multiplanar magnetic resonance images of the abdomen with and without intravenous contrast. MRCP sequences were performed. 10 mL gadobutrol  (GADAVIST ) 1 MMOL/ML injection was administered. COMPARISON: Ultrasound 08/17/2024. CLINICAL HISTORY: Cholelithiasis; r/o CBD stone. FINDINGS: LIVER: Unremarkable. GALLBLADDER AND BILIARY SYSTEM: There are multiple small 1 mm stones within the fundus of the gallbladder which are change numerous to count There is a larger 18 mm faceted stone towards the neck of the gallbladder. The gallbladder is nondistended measuring 3 cm in diameter. No gallbladder wall thickening and pericholecystic fluid. The common bile duct is mildly dilated at 5 to 10 mm. The common bile duct tapers to the ampulla without obstructing calculus. No intrahepatic ductal dilatation. SPLEEN: Unremarkable. PANCREAS/PANCREATIC DUCT: There is mild pancreatic ductal dilatation throughout the head, body, and tail of the pancreas. The pancreatic duct measures 5 mm on image  15/3. No acute pancreatic inflammation. ADRENAL GLANDS: Unremarkable. KIDNEYS: There are bilateral fluid intensity cysts within the left and right kidneys. These show no postcontrast enhancement, consistent with benign renal cysts. No hydronephrosis. LYMPH NODES: No enlarged abdominal lymph nodes. VASCULATURE: Unremarkable. PERITONEUM: No ascites. ABDOMINAL WALL: No hernia. No mass. BOWEL: Grossly unremarkable. No bowel obstruction. BONES: No acute abnormality or worrisome osseous lesion. SOFT TISSUES: Unremarkable.  MISCELLANEOUS: Unremarkable. IMPRESSION: 1. Cholelithiasis with multiple small gallstones and an 18 mm gallstone near the gallbladder neck, without imaging signs of acute cholecystitis. 2. Mild dilation of the common bile duct without an obstructing calculus identified. 3. Mild diffuse pancreatic ductal dilatation without acute pancreatitis. . Electronically signed by: Norleen Boxer MD 08/17/2024 03:06 PM EST RP Workstation: [id number]   MR ABDOMEN MRCP W WO CONTAST Result Date: 08/17/2024 EXAM: MRCP WITH AND WITHOUT IV CONTRAST 08/17/2024 02:49:11 PM TECHNIQUE: Multisequence, multiplanar magnetic resonance images of the abdomen with and without intravenous contrast. MRCP sequences were performed. 10 mL gadobutrol  (GADAVIST ) 1 MMOL/ML injection was administered. COMPARISON: Ultrasound 08/17/2024. CLINICAL HISTORY: Cholelithiasis; r/o CBD stone. FINDINGS: LIVER: Unremarkable. GALLBLADDER AND BILIARY SYSTEM: There are multiple small 1 mm stones within the fundus of the gallbladder which are change numerous to count There is a larger 18 mm faceted stone towards the neck of the gallbladder. The gallbladder is nondistended measuring 3 cm in diameter. No gallbladder wall thickening and pericholecystic fluid. The common bile duct is mildly dilated at 5 to 10 mm. The common bile duct tapers to the ampulla without obstructing calculus. No intrahepatic ductal dilatation. SPLEEN: Unremarkable. PANCREAS/PANCREATIC DUCT: There is mild pancreatic ductal dilatation throughout the head, body, and tail of the pancreas. The pancreatic duct measures 5 mm on image 15/3. No acute pancreatic inflammation. ADRENAL GLANDS: Unremarkable. KIDNEYS: There are bilateral fluid intensity cysts within the left and right kidneys. These show no postcontrast enhancement, consistent with benign renal cysts. No hydronephrosis. LYMPH NODES: No enlarged abdominal lymph nodes. VASCULATURE: Unremarkable. PERITONEUM: No ascites. ABDOMINAL WALL: No  hernia. No mass. BOWEL: Grossly unremarkable. No bowel obstruction. BONES: No acute abnormality or worrisome osseous lesion. SOFT TISSUES: Unremarkable. MISCELLANEOUS: Unremarkable. IMPRESSION: 1. Cholelithiasis with multiple small gallstones and an 18 mm gallstone near the gallbladder neck, without imaging signs of acute cholecystitis. 2. Mild dilation of the common bile duct without an obstructing calculus identified. 3. Mild diffuse pancreatic ductal dilatation without acute pancreatitis. Electronically signed by: Norleen Boxer MD 08/17/2024 03:01 PM EST RP Workstation: [id number]   DG Chest 2 View Result Date: 08/17/2024 CLINICAL DATA:  chest pain EXAM: DG CHEST 2V COMPARISON:  August 17, 2018 FINDINGS: The cardiomediastinal silhouette is mildly enlarged in contour.Atherosclerotic calcifications. No pleural effusion. No pneumothorax. No acute pleuroparenchymal abnormality. Visualized abdomen is unremarkable. Multilevel degenerative changes of the thoracic spine. Mild compression fracture deformities at the thoracolumbar junction, favored slightly increased since prior. IMPRESSION: 1. No acute cardiopulmonary abnormality. 2. Mild compression fracture deformities at the thoracolumbar junction, favored slightly increased since prior. Recommend correlation with point tenderness. Electronically Signed   By: Corean Salter M.D.   On: 08/17/2024 09:16   US  Abdomen Limited RUQ (LIVER/GB) Result Date: 08/17/2024 EXAM: Right Upper Quadrant Abdominal Ultrasound 08/17/2024 08:51:53 AM TECHNIQUE: Real-time ultrasonography of the right upper quadrant of the abdomen was performed. COMPARISON: Chest CT 11/10/2022. CLINICAL HISTORY: 73 year old female with abdominal pain. FINDINGS: LIVER: The liver demonstrates normal echogenicity. Mild intrahepatic biliary ductal dilatation suspected. No evidence of mass. BILIARY SYSTEM: Confluent and shadowing gallstones within the gallbladder (series 1  image 4). Gallbladder wall  thickness measures 0.2 cm, normal. No sonographic Murphy sign. No pericholecystic fluid. Abnormally dilated common bile duct, 8 to 9 mm. No visible filling defect within the bile duct (image 21 of series 1). OTHER: No right upper quadrant ascites. IMPRESSION: 1. Extensive Cholelithiasis, with Dilated common bile duct (9 mm) suspicious for choledocholithiasis. 2. No sonographic signs of acute cholecystitis. Electronically signed by: Helayne Hurst MD 08/17/2024 09:08 AM EST RP Workstation: [id number]    Anti-infectives: Anti-infectives (From admission, onward)    Start     Dose/Rate Route Frequency Ordered Stop   08/18/24 0800  ceFAZolin  (ANCEF ) IVPB 2g/100 mL premix        2 g 200 mL/hr over 30 Minutes Intravenous On call to O.R. 08/17/24 2025 08/18/24 1019        Assessment/Plan POD1: S/P Laparoscopic Cholecystectomy with near infrared fluorescent cholangiography by Dr. Tanda 08/18/2024. -Afebrile. -WBC 13.9; HGB 10.3 -Pain is manageable. Tolerating regular diet without n/v. Having flatus. -Abdominal exam without significant concerns. -Discussed post-operative expectations and restrictions in great detail. Does not need a work physicist, medical. Patient aware to call and confirm her outpatient follow up that is being arranged. -Stable from general surgery standpoint when medically cleared. Please call for further questions or concerns.   FEN: Regular; IVF per primary team VTE: Lovenox  ID: Surgical [id number]     LOS: 2 days   I reviewed specialist notes, hospitalist notes, nursing notes, last 24 h vitals and pain scores, last 48 h intake and output, last 24 h labs and trends, and last 24 h imaging results.   Marjorie Carlyon Favre, [facility] 08/19/2024, 8:51 AM Please see Amion for pager number during day hours 7:00am-4:30pm

## 2024-08-20 LAB — SURGICAL PATHOLOGY

## 2024-09-09 ENCOUNTER — Other Ambulatory Visit: Payer: Self-pay | Admitting: Internal Medicine

## 2024-09-09 DIAGNOSIS — I10 Essential (primary) hypertension: Secondary | ICD-10-CM

## 2024-09-13 ENCOUNTER — Other Ambulatory Visit: Payer: Self-pay

## 2024-09-13 MED ORDER — TRELEGY ELLIPTA 200-62.5-25 MCG/ACT IN AEPB: 1.0000 | INHALATION_SPRAY | Freq: Every day | RESPIRATORY_TRACT | 3 refills | Status: AC

## 2025-01-13 ENCOUNTER — Ambulatory Visit: Admitting: Physician Assistant

## 2025-07-08 ENCOUNTER — Ambulatory Visit: Admitting: Physician Assistant
